# Patient Record
Sex: Male | Born: 1970 | Race: Black or African American | Hispanic: No | Marital: Single | Smoking: Never smoker
Health system: Southern US, Community
[De-identification: ages and names within clinical notes are randomized; demographics above are authoritative.]

## PROBLEM LIST (undated history)

## (undated) DIAGNOSIS — E876 Hypokalemia: Secondary | ICD-10-CM

## (undated) DIAGNOSIS — I10 Essential (primary) hypertension: Secondary | ICD-10-CM

## (undated) HISTORY — PX: ROTATOR CUFF REPAIR: SHX139

---

## 2002-03-07 ENCOUNTER — Emergency Department (HOSPITAL_COMMUNITY): Admission: EM | Admit: 2002-03-07 | Discharge: 2002-03-07 | Payer: Self-pay | Admitting: Emergency Medicine

## 2011-08-06 ENCOUNTER — Emergency Department (HOSPITAL_COMMUNITY)
Admission: EM | Admit: 2011-08-06 | Discharge: 2011-08-06 | Disposition: A | Discharge status: Home / Self Care | Payer: Self-pay | Attending: Emergency Medicine | Admitting: Emergency Medicine

## 2011-08-06 ENCOUNTER — Encounter (HOSPITAL_COMMUNITY): Payer: Self-pay

## 2011-08-06 DIAGNOSIS — S76219A Strain of adductor muscle, fascia and tendon of unspecified thigh, initial encounter: Secondary | ICD-10-CM

## 2011-08-06 DIAGNOSIS — IMO0002 Reserved for concepts with insufficient information to code with codable children: Secondary | ICD-10-CM | POA: Insufficient documentation

## 2011-08-06 DIAGNOSIS — Y998 Other external cause status: Secondary | ICD-10-CM | POA: Insufficient documentation

## 2011-08-06 DIAGNOSIS — Y9389 Activity, other specified: Secondary | ICD-10-CM | POA: Insufficient documentation

## 2011-08-06 DIAGNOSIS — X58XXXA Exposure to other specified factors, initial encounter: Secondary | ICD-10-CM | POA: Insufficient documentation

## 2011-08-06 MED ORDER — HYDROCODONE-ACETAMINOPHEN 5-325 MG PO TABS
1.0000 | ORAL_TABLET | Freq: Once | ORAL | Status: AC
  Administered 2011-08-06: 1 via ORAL
  Filled 2011-08-06: qty 1

## 2011-08-06 MED ORDER — IBUPROFEN 800 MG PO TABS
800.0000 mg | ORAL_TABLET | Freq: Once | ORAL | Status: AC
  Administered 2011-08-06: 800 mg via ORAL
  Filled 2011-08-06: qty 1

## 2011-08-06 MED ORDER — HYDROCODONE-ACETAMINOPHEN 5-325 MG PO TABS: 1.0000 | ORAL_TABLET | ORAL | Status: AC | PRN

## 2011-08-06 NOTE — ED Notes (Signed)
Started hurting when I got out of my truck on Thursday evening. Hurts in the crease between the groin and my left leg. Pain shoots down left leg per pt. Denies testicular pain. Hurts when moving a certain way.

## 2011-08-06 NOTE — Discharge Instructions (Signed)
Apply heat to the site for comfort. Use ibuprofen 800 mg three times a day. Use the stronger pain medicine in addition to the ibuprofen.

## 2011-08-06 NOTE — ED Provider Notes (Signed)
History     CSN: [id number]  Arrival date & time 08/06/11  0101   First MD Initiated Contact with Patient 08/06/11 [phone]      Chief Complaint  Patient presents with  . Groin Pain    (Consider location/radiation/quality/duration/timing/severity/associated sxs/prior treatment) HPI Mario Alvarez is a 41 y.o. male who presents to the Emergency Department complaining of left groin pain that began when he climbed out of his truck on Thursday. Pain is sharp and located in the crease of his left leg. It does not extend to the scrotum however it can with certain movements cause pain in the upper thigh. Pain limits ambulation at times due to severity. He has taken low dose ibuprofen with no relief.He denies weakness, numbness, tingling.  History reviewed. No pertinent past medical history.  History reviewed. No pertinent past surgical history.  History reviewed. No pertinent family history.  History  Substance Use Topics  . Smoking status: Never Smoker   . Smokeless tobacco: Not on file  . Alcohol Use: No      Review of Systems  Constitutional: Negative for fever.       10 Systems reviewed and are negative for acute change except as noted in the HPI.  HENT: Negative for congestion.   Eyes: Negative for discharge and redness.  Respiratory: Negative for cough and shortness of breath.   Cardiovascular: Negative for chest pain.  Gastrointestinal: Negative for vomiting and abdominal pain.  Musculoskeletal: Negative for back pain.       Groin pain  Skin: Negative for rash.  Neurological: Negative for syncope, numbness and headaches.  Psychiatric/Behavioral:       No behavior change.    Allergies  Review of patient's allergies indicates no known allergies.  Home Medications   Current Outpatient Rx  Name Route Sig Dispense Refill  . HYDROCODONE-ACETAMINOPHEN 5-325 MG PO TABS Oral Take 1 tablet by mouth every 4 (four) hours as needed for pain. 10 tablet 0    BP 171/86  Pulse  81  Temp 99.3 F (37.4 C) (Oral)  Resp 16  Ht 6' (1.829 m)  Wt 264 lb (119.75 kg)  BMI 35.80 kg/m2  SpO2 97%  Physical Exam  Nursing note and vitals reviewed. Constitutional: He appears well-developed and well-nourished.       Awake, alert, nontoxic appearance.  HENT:  Head: Atraumatic.  Eyes: Right eye exhibits no discharge. Left eye exhibits no discharge.  Neck: Neck supple.  Pulmonary/Chest: Effort normal. He exhibits no tenderness.  Abdominal: Soft. There is no tenderness. There is no rebound.  Musculoskeletal: He exhibits no tenderness.       Baseline ROM, no obvious new focal weakness.Focal tenderness to the adductor/symphysis region on the left with palpation. FROM to left hip. Leg strength 5/5. No swelling noted  Neurological:       Mental status and motor strength appears baseline for patient and situation.  Skin: No rash noted.  Psychiatric: He has a normal mood and affect.    ED Course  Procedures (including critical care time)  Labs Reviewed - No data to display No results found.   1. Groin strain       MDM  Patient with groin strain x 2 days precipitated by twisting as he got out of his truck. Given antiinflammatory and analgesic. Pt stable in ED with no significant deterioration in condition.The patient appears reasonably screened and/or stabilized for discharge and I doubt any other medical condition or other [facility] requiring further screening, evaluation,  or treatment in the ED at this time prior to discharge.  MDM Reviewed: nursing note and vitals           Nicoletta Dress. Colon Branch, MD 08/06/11 2024

## 2013-09-12 ENCOUNTER — Encounter (HOSPITAL_COMMUNITY): Payer: Self-pay | Admitting: Emergency Medicine

## 2013-09-12 ENCOUNTER — Emergency Department (HOSPITAL_COMMUNITY): Payer: PRIVATE HEALTH INSURANCE

## 2013-09-12 ENCOUNTER — Emergency Department (HOSPITAL_COMMUNITY)
Admission: EM | Admit: 2013-09-12 | Discharge: 2013-09-12 | Disposition: A | Discharge status: Home / Self Care | Payer: PRIVATE HEALTH INSURANCE | Attending: Emergency Medicine | Admitting: Emergency Medicine

## 2013-09-12 DIAGNOSIS — I10 Essential (primary) hypertension: Secondary | ICD-10-CM | POA: Insufficient documentation

## 2013-09-12 DIAGNOSIS — R42 Dizziness and giddiness: Secondary | ICD-10-CM | POA: Insufficient documentation

## 2013-09-12 DIAGNOSIS — R110 Nausea: Secondary | ICD-10-CM | POA: Diagnosis not present

## 2013-09-12 DIAGNOSIS — R51 Headache: Secondary | ICD-10-CM | POA: Diagnosis not present

## 2013-09-12 DIAGNOSIS — R519 Headache, unspecified: Secondary | ICD-10-CM

## 2013-09-12 DIAGNOSIS — R5381 Other malaise: Secondary | ICD-10-CM | POA: Diagnosis not present

## 2013-09-12 DIAGNOSIS — R6883 Chills (without fever): Secondary | ICD-10-CM | POA: Diagnosis not present

## 2013-09-12 DIAGNOSIS — R1084 Generalized abdominal pain: Secondary | ICD-10-CM | POA: Diagnosis not present

## 2013-09-12 DIAGNOSIS — R5383 Other fatigue: Secondary | ICD-10-CM

## 2013-09-12 DIAGNOSIS — R109 Unspecified abdominal pain: Secondary | ICD-10-CM

## 2013-09-12 LAB — CBC WITH DIFFERENTIAL/PLATELET
BASOS ABS: 0 10*3/uL (ref 0.0–0.1)
BASOS PCT: 0 % (ref 0–1)
Eosinophils Absolute: 0 10*3/uL (ref 0.0–0.7)
Eosinophils Relative: 0 % (ref 0–5)
HCT: 39.3 % (ref 39.0–52.0)
HEMOGLOBIN: 14.5 g/dL (ref 13.0–17.0)
Lymphocytes Relative: 4 % — ABNORMAL LOW (ref 12–46)
Lymphs Abs: 0.9 10*3/uL (ref 0.7–4.0)
MCH: 31.2 pg (ref 26.0–34.0)
MCHC: 36.9 g/dL — AB (ref 30.0–36.0)
MCV: 84.5 fL (ref 78.0–100.0)
MONOS PCT: 6 % (ref 3–12)
Monocytes Absolute: 1.3 10*3/uL — ABNORMAL HIGH (ref 0.1–1.0)
NEUTROS ABS: 18.9 10*3/uL — AB (ref 1.7–7.7)
NEUTROS PCT: 90 % — AB (ref 43–77)
Platelets: 190 10*3/uL (ref 150–400)
RBC: 4.65 MIL/uL (ref 4.22–5.81)
RDW: 13.5 % (ref 11.5–15.5)
WBC: 21.1 10*3/uL — ABNORMAL HIGH (ref 4.0–10.5)

## 2013-09-12 LAB — URINALYSIS, ROUTINE W REFLEX MICROSCOPIC
BILIRUBIN URINE: NEGATIVE
Glucose, UA: NEGATIVE mg/dL
HGB URINE DIPSTICK: NEGATIVE
Ketones, ur: NEGATIVE mg/dL
Leukocytes, UA: NEGATIVE
Nitrite: NEGATIVE
PH: 6 (ref 5.0–8.0)
Protein, ur: NEGATIVE mg/dL
Urobilinogen, UA: 0.2 mg/dL (ref 0.0–1.0)

## 2013-09-12 LAB — COMPREHENSIVE METABOLIC PANEL
ALBUMIN: 4.2 g/dL (ref 3.5–5.2)
ALK PHOS: 43 U/L (ref 39–117)
ALT: 18 U/L (ref 0–53)
AST: 20 U/L (ref 0–37)
Anion gap: 14 (ref 5–15)
BILIRUBIN TOTAL: 0.8 mg/dL (ref 0.3–1.2)
BUN: 13 mg/dL (ref 6–23)
CHLORIDE: 95 meq/L — AB (ref 96–112)
CO2: 28 mEq/L (ref 19–32)
Calcium: 9.5 mg/dL (ref 8.4–10.5)
Creatinine, Ser: 1.06 mg/dL (ref 0.50–1.35)
GFR calc Af Amer: >90 mL/min (ref >90)
GFR calc non Af Amer: 86 mL/min — ABNORMAL LOW (ref >90)
Glucose, Bld: 137 mg/dL — ABNORMAL HIGH (ref 70–99)
POTASSIUM: 3.7 meq/L (ref 3.7–5.3)
Sodium: 137 mEq/L (ref 137–147)
Total Protein: 7.9 g/dL (ref 6.0–8.3)

## 2013-09-12 LAB — LIPASE, BLOOD: Lipase: 21 U/L (ref 11–59)

## 2013-09-12 MED ORDER — IOHEXOL 300 MG/ML  SOLN
100.0000 mL | Freq: Once | INTRAMUSCULAR | Status: AC | PRN
  Administered 2013-09-12: 100 mL via INTRAVENOUS

## 2013-09-12 MED ORDER — SODIUM CHLORIDE 0.9 % IV BOLUS (SEPSIS): 1000.0000 mL | Freq: Once | INTRAVENOUS | Status: DC

## 2013-09-12 MED ORDER — MORPHINE SULFATE 4 MG/ML IJ SOLN
4.0000 mg | Freq: Once | INTRAMUSCULAR | Status: AC
  Administered 2013-09-12: 4 mg via INTRAVENOUS
  Filled 2013-09-12: qty 1

## 2013-09-12 MED ORDER — IOHEXOL 300 MG/ML  SOLN
50.0000 mL | Freq: Once | INTRAMUSCULAR | Status: AC | PRN
  Administered 2013-09-12: 50 mL via ORAL

## 2013-09-12 MED ORDER — SODIUM CHLORIDE 0.9 % IV BOLUS (SEPSIS)
1000.0000 mL | Freq: Once | INTRAVENOUS | Status: AC
  Administered 2013-09-12: 1000 mL via INTRAVENOUS

## 2013-09-12 MED ORDER — ONDANSETRON HCL 4 MG/2ML IJ SOLN
4.0000 mg | Freq: Once | INTRAMUSCULAR | Status: AC
  Administered 2013-09-12: 4 mg via INTRAVENOUS
  Filled 2013-09-12: qty 2

## 2013-09-12 NOTE — ED Notes (Signed)
MD at bedside. 

## 2013-09-12 NOTE — ED Notes (Signed)
C/o headache and SOB,  Had dizziness this am at work.  C/o abdomen mid to upper pain, rating 7.

## 2013-09-12 NOTE — ED Provider Notes (Signed)
CSN: [id number]     Arrival date & time 09/12/13  1238 History   First MD Initiated Contact with Patient 09/12/13 1251     Chief Complaint  Patient presents with  . Dizziness      Patient is a 43 y.o. male presenting with dizziness. The history is provided by the patient and a relative.  Dizziness Quality:  Lightheadedness Severity:  Moderate Onset quality:  Gradual Duration: several hours. Timing:  Constant Progression:  Worsening Chronicity:  New Relieved by:  Being still Worsened by:  Movement Associated symptoms: headaches, nausea and weakness   Associated symptoms: no blood in stool, no chest pain, no syncope and no vomiting   Associated symptoms comment:  "loosening stool"  pt reports he ate crab legs at [employer] two nights ago Ever since, he has had nausea, some dizziness, headache and abdominal pain No cough/sore throat No rash/tick bites He reports his stool is "loosening"   PMH -HTN   History  Substance Use Topics  . Smoking status: Never Smoker   . Smokeless tobacco: Not on file  . Alcohol Use: No    Review of Systems  Constitutional: Positive for chills and fatigue.  Respiratory: Negative for cough.   Cardiovascular: Negative for chest pain and syncope.  Gastrointestinal: Positive for nausea. Negative for vomiting and blood in stool.  Genitourinary: Negative for dysuria.  Neurological: Positive for dizziness and headaches.  All other systems reviewed and are negative.     Allergies  Review of patient's allergies indicates no known allergies.  Home Medications   Prior to Admission medications   Not on File   BP 124/60  Pulse 103  Temp(Src) 99.5 F (37.5 C) (Oral)  Resp 20  Ht 6' (1.829 m)  Wt 260 lb (117.935 kg)  BMI 35.25 kg/m2  SpO2 99% Physical Exam CONSTITUTIONAL: Well developed/well nourished HEAD: Normocephalic/atraumatic EYES: EOMI/PERRL ENMT: Mucous membranes moist NECK: supple no meningeal signs SPINE:entire spine  nontender CV: S1/S2 noted, no murmurs/rubs/gallops noted LUNGS: Lungs are clear to auscultation bilaterally, no apparent distress ABDOMEN: soft, diffuse mild tenderness noted, no rebound or guarding GU:no cva tenderness NEURO: Pt is awake/alert, moves all extremitiesx4. No arm/leg drift.  He can ambulate EXTREMITIES: pulses normal, full ROM SKIN: warm, color normal PSYCH: no abnormalities of mood noted  ED Course  Procedures   3:42 PM Pt observed for several hours I elected to perform CT abd/pelvis as pt had increasing abd pain No signs of acute appendicitis or other acute abd process (likely small liver hemangioma) He is ambulatory He is taking PO Aside from elevated WBC, Other labs reassuring Given symptoms of dizziness/abd pain/HA and also suspect pt has had fever.  Suspect viral syndrome Pt began to have mild cough during discharge - lungs clear, no hypoxia We discussed strict return precautions BP 111/54  Pulse 102  Temp(Src) 99.6 F (37.6 C) (Oral)  Resp 18  Ht 6' (1.829 m)  Wt 260 lb (117.935 kg)  BMI 35.25 kg/m2  SpO2 97%  Labs Review Labs Reviewed  CBC WITH DIFFERENTIAL - Abnormal; Notable for the following:    WBC 21.1 (*)    MCHC 36.9 (*)    Neutrophils Relative % 90 (*)    Neutro Abs 18.9 (*)    Lymphocytes Relative 4 (*)    Monocytes Absolute 1.3 (*)    All other components within normal limits  COMPREHENSIVE METABOLIC PANEL  LIPASE, BLOOD  URINALYSIS, ROUTINE W REFLEX MICROSCOPIC    Imaging Review Ct Abdomen  Pelvis W Contrast  09/12/2013   CLINICAL DATA:  Abdominal pain  EXAM: CT ABDOMEN AND PELVIS WITH CONTRAST  TECHNIQUE: Multidetector CT imaging of the abdomen and pelvis was performed using the standard protocol following bolus administration of intravenous contrast.  CONTRAST:  50mL OMNIPAQUE IOHEXOL 300 MG/ML SOLN, OMNIPAQUE IOHEXOL 300 MG/ML SOLN  COMPARISON:  None.  FINDINGS: Lung bases are clear.  No effusions.  Heart is normal size.   Low-density area within the right hepatic lobe measuring up to 2.9 cm, nonspecific. This may reflect a hemangioma. This does not appear to represent a cyst.  Gallbladder, spleen, pancreas, adrenals and kidneys are normal.  Normal appendix. Stomach, large and small bowel are unremarkable. No free fluid, free air or adenopathy. Aorta is normal caliber.  No acute bony abnormality.  IMPRESSION: Normal appendix.  Nonspecific 2.8 cm low-density lesion with in the liver. This could reflect a hemangioma. This could be further characterized with MRI.   Electronically Signed   By: Charlett Nose M.D.   On: 09/12/2013 15:03     Date: 09/12/2013 1337  Rate: 105  Rhythm: sinus tachycardia  QRS Axis: normal  Intervals: normal  ST/T Wave abnormalities: t wave flattening  Conduction Disutrbances:none    MDM   Final diagnoses:  Nonintractable headache, unspecified chronicity pattern, unspecified headache type  Abdominal pain, unspecified abdominal location  Dizziness    Nursing notes including past medical history and social history reviewed and considered in documentation Labs/vital reviewed and considered     Joya Gaskins, MD 09/12/13 1544

## 2013-09-12 NOTE — Discharge Instructions (Signed)
Abdominal (belly) pain can be caused by many things. any cases can be observed and treated at home after initial evaluation in the emergency department. Even though you are being discharged home, abdominal pain can be unpredictable. Therefore, you need a repeated exam if your pain does not resolve, returns, or worsens. Most patients with abdominal pain don't have to be admitted to the hospital or have surgery, but serious problems like appendicitis and gallbladder attacks can start out as nonspecific pain. Many abdominal conditions cannot be diagnosed in one visit, so follow-up evaluations are very important. °SEEK IMMEDIATE MEDICAL ATTENTION IF: °The pain does not go away or becomes severe, particularly over the next 8-12 hours.  °A temperature above 100.4F develops.  °Repeated vomiting occurs (multiple episodes).  °The pain becomes localized to portions of the abdomen. The right side could possibly be appendicitis. In an adult, the left lower portion of the abdomen could be colitis or diverticulitis.  °Blood is being passed in stools or vomit (bright red or black tarry stools).  °Return also if you develop chest pain, difficulty breathing, dizziness or fainting, or become confused, poorly responsive, or inconsolable. ° ° °You are having a headache. No specific cause was found today for your headache. It may have been a migraine or other cause of headache. Stress, anxiety, fatigue, and depression are common triggers for headaches. Your headache today does not appear to be life-threatening or require hospitalization, but often the exact cause of headaches is not determined in the emergency department. Therefore, follow-up with your doctor is very important to find out what may have caused your headache, and whether or not you need any further diagnostic testing or treatment. Sometimes headaches can appear benign (not harmful), but then more serious symptoms can develop which should prompt an immediate re-evaluation  by your doctor or the emergency department. ° °SEEK MEDICAL ATTENTION IF: ° °You develop possible problems with medications prescribed.  °The medications don't resolve your headache, if it recurs , or if you have multiple episodes of vomiting or can't take fluids. °You have a change from the usual headache. ° °RETURN IMMEDIATELY IF you develop a sudden, severe headache or confusion, become poorly responsive or faint, develop a fever above 100.4F or problem breathing, have a change in speech, vision, swallowing, or understanding, or develop new weakness, numbness, tingling, incoordination, or have a seizure. ° °

## 2016-07-08 ENCOUNTER — Emergency Department (HOSPITAL_COMMUNITY)
Admission: EM | Admit: 2016-07-08 | Discharge: 2016-07-08 | Disposition: A | Discharge status: Home / Self Care | Payer: BLUE CROSS/BLUE SHIELD | Attending: Emergency Medicine | Admitting: Emergency Medicine

## 2016-07-08 ENCOUNTER — Encounter (HOSPITAL_COMMUNITY): Payer: Self-pay

## 2016-07-08 DIAGNOSIS — Y998 Other external cause status: Secondary | ICD-10-CM | POA: Insufficient documentation

## 2016-07-08 DIAGNOSIS — Z79899 Other long term (current) drug therapy: Secondary | ICD-10-CM | POA: Insufficient documentation

## 2016-07-08 DIAGNOSIS — M5416 Radiculopathy, lumbar region: Secondary | ICD-10-CM | POA: Insufficient documentation

## 2016-07-08 DIAGNOSIS — I10 Essential (primary) hypertension: Secondary | ICD-10-CM | POA: Insufficient documentation

## 2016-07-08 DIAGNOSIS — Y92410 Unspecified street and highway as the place of occurrence of the external cause: Secondary | ICD-10-CM | POA: Diagnosis not present

## 2016-07-08 DIAGNOSIS — S161XXA Strain of muscle, fascia and tendon at neck level, initial encounter: Secondary | ICD-10-CM | POA: Diagnosis not present

## 2016-07-08 DIAGNOSIS — R51 Headache: Secondary | ICD-10-CM | POA: Insufficient documentation

## 2016-07-08 DIAGNOSIS — Y9389 Activity, other specified: Secondary | ICD-10-CM | POA: Diagnosis not present

## 2016-07-08 DIAGNOSIS — R519 Headache, unspecified: Secondary | ICD-10-CM

## 2016-07-08 DIAGNOSIS — M545 Low back pain: Secondary | ICD-10-CM | POA: Diagnosis present

## 2016-07-08 HISTORY — DX: Essential (primary) hypertension: I10

## 2016-07-08 HISTORY — DX: Hypokalemia: E87.6

## 2016-07-08 MED ORDER — LORAZEPAM 2 MG/ML IJ SOLN
1.0000 mg | Freq: Once | INTRAMUSCULAR | Status: AC
  Administered 2016-07-08: 1 mg via INTRAVENOUS
  Filled 2016-07-08: qty 1

## 2016-07-08 MED ORDER — ONDANSETRON HCL 4 MG/2ML IJ SOLN
4.0000 mg | Freq: Once | INTRAMUSCULAR | Status: AC
  Administered 2016-07-08: 4 mg via INTRAVENOUS
  Filled 2016-07-08: qty 2

## 2016-07-08 MED ORDER — HYDROCODONE-ACETAMINOPHEN 5-325 MG PO TABS: 2.0000 | ORAL_TABLET | ORAL | 0 refills | Status: DC | PRN

## 2016-07-08 MED ORDER — DIAZEPAM 5 MG PO TABS: 5.0000 mg | ORAL_TABLET | Freq: Two times a day (BID) | ORAL | 0 refills | Status: DC

## 2016-07-08 MED ORDER — DEXAMETHASONE SODIUM PHOSPHATE 10 MG/ML IJ SOLN
10.0000 mg | Freq: Once | INTRAMUSCULAR | Status: AC
  Administered 2016-07-08: 10 mg via INTRAVENOUS
  Filled 2016-07-08: qty 1

## 2016-07-08 MED ORDER — PREDNISONE 10 MG (21) PO TBPK: ORAL_TABLET | ORAL | 0 refills | Status: DC

## 2016-07-08 MED ORDER — MORPHINE SULFATE (PF) 4 MG/ML IV SOLN
4.0000 mg | Freq: Once | INTRAVENOUS | Status: AC
  Administered 2016-07-08: 4 mg via INTRAVENOUS
  Filled 2016-07-08: qty 1
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
## 2016-07-11 ENCOUNTER — Other Ambulatory Visit (HOSPITAL_COMMUNITY): Payer: Self-pay | Admitting: Internal Medicine

## 2016-07-11 ENCOUNTER — Ambulatory Visit (HOSPITAL_COMMUNITY)
Admission: RE | Admit: 2016-07-11 | Discharge: 2016-07-11 | Disposition: A | Discharge status: Home / Self Care | Payer: BLUE CROSS/BLUE SHIELD | Source: Ambulatory Visit | Attending: Emergency Medicine | Admitting: Emergency Medicine

## 2016-07-11 DIAGNOSIS — E119 Type 2 diabetes mellitus without complications: Secondary | ICD-10-CM | POA: Insufficient documentation

## 2016-07-11 DIAGNOSIS — R200 Anesthesia of skin: Secondary | ICD-10-CM | POA: Insufficient documentation

## 2016-07-11 DIAGNOSIS — R51 Headache: Secondary | ICD-10-CM | POA: Diagnosis not present

## 2016-07-11 DIAGNOSIS — R519 Headache, unspecified: Secondary | ICD-10-CM

## 2016-07-11 DIAGNOSIS — I10 Essential (primary) hypertension: Secondary | ICD-10-CM | POA: Diagnosis not present

## 2016-07-11 DIAGNOSIS — H538 Other visual disturbances: Secondary | ICD-10-CM | POA: Diagnosis not present

## 2016-07-11 DIAGNOSIS — M48061 Spinal stenosis, lumbar region without neurogenic claudication: Secondary | ICD-10-CM | POA: Insufficient documentation

## 2016-07-11 DIAGNOSIS — M5126 Other intervertebral disc displacement, lumbar region: Secondary | ICD-10-CM | POA: Insufficient documentation

## 2016-08-28 ENCOUNTER — Other Ambulatory Visit (HOSPITAL_COMMUNITY): Payer: Self-pay | Admitting: Emergency Medicine

## 2016-08-28 ENCOUNTER — Other Ambulatory Visit (HOSPITAL_COMMUNITY): Payer: Self-pay | Admitting: Internal Medicine

## 2016-08-28 DIAGNOSIS — R221 Localized swelling, mass and lump, neck: Secondary | ICD-10-CM

## 2016-08-28 DIAGNOSIS — G459 Transient cerebral ischemic attack, unspecified: Secondary | ICD-10-CM

## 2016-09-01 ENCOUNTER — Ambulatory Visit (HOSPITAL_COMMUNITY)
Admission: RE | Admit: 2016-09-01 | Discharge: 2016-09-01 | Disposition: A | Discharge status: Home / Self Care | Payer: BLUE CROSS/BLUE SHIELD | Source: Ambulatory Visit | Attending: Internal Medicine | Admitting: Internal Medicine

## 2016-09-01 DIAGNOSIS — G459 Transient cerebral ischemic attack, unspecified: Secondary | ICD-10-CM | POA: Insufficient documentation

## 2016-09-01 DIAGNOSIS — R221 Localized swelling, mass and lump, neck: Secondary | ICD-10-CM | POA: Insufficient documentation
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
## 2020-07-16 ENCOUNTER — Other Ambulatory Visit (HOSPITAL_COMMUNITY): Payer: Self-pay | Admitting: Neurosurgery

## 2020-07-16 ENCOUNTER — Other Ambulatory Visit: Payer: Self-pay | Admitting: Neurosurgery

## 2020-07-16 DIAGNOSIS — G959 Disease of spinal cord, unspecified: Secondary | ICD-10-CM

## 2020-07-17 ENCOUNTER — Ambulatory Visit (HOSPITAL_COMMUNITY)
Admission: RE | Admit: 2020-07-17 | Discharge: 2020-07-17 | Disposition: A | Discharge status: Home / Self Care | Payer: 59 | Source: Ambulatory Visit | Attending: Neurosurgery | Admitting: Neurosurgery

## 2020-07-17 ENCOUNTER — Other Ambulatory Visit: Payer: Self-pay

## 2020-07-17 ENCOUNTER — Inpatient Hospital Stay (HOSPITAL_COMMUNITY)
Admission: AD | Admit: 2020-07-17 | Discharge: 2020-07-20 | DRG: 473 | Disposition: A | Discharge status: Home Health | Payer: 59 | Source: Ambulatory Visit | Attending: Neurosurgery | Admitting: Neurosurgery

## 2020-07-17 DIAGNOSIS — Z888 Allergy status to other drugs, medicaments and biological substances status: Secondary | ICD-10-CM

## 2020-07-17 DIAGNOSIS — G959 Disease of spinal cord, unspecified: Secondary | ICD-10-CM

## 2020-07-17 DIAGNOSIS — Z20822 Contact with and (suspected) exposure to covid-19: Secondary | ICD-10-CM | POA: Diagnosis present

## 2020-07-17 DIAGNOSIS — Z79899 Other long term (current) drug therapy: Secondary | ICD-10-CM

## 2020-07-17 DIAGNOSIS — Z419 Encounter for procedure for purposes other than remedying health state, unspecified: Secondary | ICD-10-CM

## 2020-07-17 DIAGNOSIS — M4802 Spinal stenosis, cervical region: Secondary | ICD-10-CM | POA: Diagnosis present

## 2020-07-17 DIAGNOSIS — I10 Essential (primary) hypertension: Secondary | ICD-10-CM | POA: Diagnosis present

## 2020-07-17 DIAGNOSIS — M50022 Cervical disc disorder at C5-C6 level with myelopathy: Principal | ICD-10-CM | POA: Diagnosis present

## 2020-07-17 LAB — BASIC METABOLIC PANEL
Anion gap: 9 (ref 5–15)
BUN: 13 mg/dL (ref 6–20)
CO2: 27 mmol/L (ref 22–32)
Calcium: 9.2 mg/dL (ref 8.9–10.3)
Chloride: 101 mmol/L (ref 98–111)
Creatinine, Ser: 1.03 mg/dL (ref 0.61–1.24)
GFR, Estimated: >60 mL/min (ref >60)
Glucose, Bld: 117 mg/dL — ABNORMAL HIGH (ref 70–99)
Potassium: 3.6 mmol/L (ref 3.5–5.1)
Sodium: 137 mmol/L (ref 135–145)

## 2020-07-17 LAB — CBC
HCT: 38.5 % — ABNORMAL LOW (ref 39.0–52.0)
Hemoglobin: 13.9 g/dL (ref 13.0–17.0)
MCH: 30.5 pg (ref 26.0–34.0)
MCHC: 36.1 g/dL — ABNORMAL HIGH (ref 30.0–36.0)
MCV: 84.6 fL (ref 80.0–100.0)
Platelets: 208 10*3/uL (ref 150–400)
RBC: 4.55 MIL/uL (ref 4.22–5.81)
RDW: 12.6 % (ref 11.5–15.5)
WBC: 8.4 10*3/uL (ref 4.0–10.5)
nRBC: 0 % (ref 0.0–0.2)

## 2020-07-17 LAB — MRSA NEXT GEN BY PCR, NASAL: MRSA by PCR Next Gen: NOT DETECTED

## 2020-07-17 LAB — SARS CORONAVIRUS 2 (TAT 6-24 HRS): SARS Coronavirus 2: NEGATIVE

## 2020-07-17 LAB — HIV ANTIBODY (ROUTINE TESTING W REFLEX): HIV Screen 4th Generation wRfx: NONREACTIVE

## 2020-07-17 MED ORDER — BISACODYL 10 MG RE SUPP: 10.0000 mg | Freq: Every day | RECTAL | Status: DC | PRN

## 2020-07-17 MED ORDER — GABAPENTIN 600 MG PO TABS
600.0000 mg | ORAL_TABLET | Freq: Three times a day (TID) | ORAL | Status: DC
  Administered 2020-07-17 – 2020-07-20 (×8): 600 mg via ORAL
  Filled 2020-07-17 (×8): qty 1

## 2020-07-17 MED ORDER — DEXAMETHASONE SODIUM PHOSPHATE 4 MG/ML IJ SOLN
4.0000 mg | Freq: Four times a day (QID) | INTRAMUSCULAR | Status: DC
  Filled 2020-07-17 (×2): qty 1

## 2020-07-17 MED ORDER — METFORMIN HCL 500 MG PO TABS
500.0000 mg | ORAL_TABLET | Freq: Two times a day (BID) | ORAL | Status: DC
  Administered 2020-07-17 – 2020-07-20 (×4): 500 mg via ORAL
  Filled 2020-07-17 (×4): qty 1

## 2020-07-17 MED ORDER — SODIUM CHLORIDE 0.9% FLUSH: 3.0000 mL | INTRAVENOUS | Status: DC | PRN

## 2020-07-17 MED ORDER — SODIUM CHLORIDE 0.9 % IV SOLN: 250.0000 mL | INTRAVENOUS | Status: DC | PRN

## 2020-07-17 MED ORDER — HYDROCHLOROTHIAZIDE 12.5 MG PO CAPS
12.5000 mg | ORAL_CAPSULE | Freq: Every day | ORAL | Status: DC
  Administered 2020-07-17 – 2020-07-20 (×4): 12.5 mg via ORAL
  Filled 2020-07-17 (×4): qty 1

## 2020-07-17 MED ORDER — LOSARTAN POTASSIUM 50 MG PO TABS
50.0000 mg | ORAL_TABLET | Freq: Every day | ORAL | Status: DC
  Administered 2020-07-17 – 2020-07-20 (×4): 50 mg via ORAL
  Filled 2020-07-17 (×4): qty 1

## 2020-07-17 MED ORDER — DEXAMETHASONE SODIUM PHOSPHATE 4 MG/ML IJ SOLN
4.0000 mg | Freq: Four times a day (QID) | INTRAMUSCULAR | Status: AC
  Administered 2020-07-17 – 2020-07-20 (×11): 4 mg via INTRAVENOUS
  Filled 2020-07-17 (×12): qty 1

## 2020-07-17 MED ORDER — TIZANIDINE HCL 4 MG PO TABS
4.0000 mg | ORAL_TABLET | Freq: Three times a day (TID) | ORAL | Status: DC | PRN
  Administered 2020-07-17 – 2020-07-20 (×6): 4 mg via ORAL
  Filled 2020-07-17 (×7): qty 1

## 2020-07-17 MED ORDER — MAGNESIUM OXIDE -MG SUPPLEMENT 400 (240 MG) MG PO TABS
400.0000 mg | ORAL_TABLET | Freq: Every day | ORAL | Status: DC
  Administered 2020-07-17 – 2020-07-20 (×4): 400 mg via ORAL
  Filled 2020-07-17 (×4): qty 1

## 2020-07-17 MED ORDER — LOSARTAN POTASSIUM-HCTZ 50-12.5 MG PO TABS: 1.0000 | ORAL_TABLET | Freq: Every day | ORAL | Status: DC

## 2020-07-17 MED ORDER — POLYETHYLENE GLYCOL 3350 17 G PO PACK: 17.0000 g | PACK | Freq: Every day | ORAL | Status: DC | PRN

## 2020-07-17 MED ORDER — DEXAMETHASONE SODIUM PHOSPHATE 10 MG/ML IJ SOLN
10.0000 mg | Freq: Once | INTRAMUSCULAR | Status: AC
  Administered 2020-07-17: 10 mg via INTRAVENOUS
  Filled 2020-07-17: qty 1

## 2020-07-17 MED ORDER — FLEET ENEMA 7-19 GM/118ML RE ENEM: 1.0000 | ENEMA | Freq: Once | RECTAL | Status: DC | PRN

## 2020-07-17 MED ORDER — DIAZEPAM 5 MG PO TABS: 5.0000 mg | ORAL_TABLET | Freq: Two times a day (BID) | ORAL | Status: DC

## 2020-07-17 MED ORDER — HYDROCODONE-ACETAMINOPHEN 5-325 MG PO TABS
2.0000 | ORAL_TABLET | ORAL | Status: DC | PRN
  Administered 2020-07-17 – 2020-07-18 (×4): 2 via ORAL
  Filled 2020-07-17 (×4): qty 2

## 2020-07-17 MED ORDER — ACETAMINOPHEN 325 MG PO TABS: 650.0000 mg | ORAL_TABLET | Freq: Four times a day (QID) | ORAL | Status: DC | PRN

## 2020-07-17 MED ORDER — DOCUSATE SODIUM 100 MG PO CAPS
100.0000 mg | ORAL_CAPSULE | Freq: Two times a day (BID) | ORAL | Status: DC
  Administered 2020-07-17 – 2020-07-20 (×6): 100 mg via ORAL
  Filled 2020-07-17 (×6): qty 1

## 2020-07-17 MED ORDER — ACETAMINOPHEN 650 MG RE SUPP: 650.0000 mg | Freq: Four times a day (QID) | RECTAL | Status: DC | PRN

## 2020-07-17 MED ORDER — POTASSIUM CHLORIDE CRYS ER 10 MEQ PO TBCR
10.0000 meq | EXTENDED_RELEASE_TABLET | Freq: Every day | ORAL | Status: DC
  Administered 2020-07-17 – 2020-07-20 (×4): 10 meq via ORAL
  Filled 2020-07-17 (×6): qty 1

## 2020-07-17 MED ORDER — SODIUM CHLORIDE 0.9% FLUSH
3.0000 mL | Freq: Two times a day (BID) | INTRAVENOUS | Status: DC
  Administered 2020-07-17 – 2020-07-19 (×4): 3 mL via INTRAVENOUS

## 2020-07-17 NOTE — H&P (Signed)
Mario Alvarez is an 50 y.o. male.   Chief Complaint: Weakness HPI: 50 year old male with progressive bilateral upper extremity numbness weakness and some intermittent incontinence.  Patient recently seen in the office by Dr. Venetia Maxon who arranged an urgent MRI scan of the cervical spine.  This demonstrates evidence of an extremely large C5-6 disc herniation with critical spinal cord compression and marked signal abnormality.  He has some mild disc bulging on the left side at C4-5 with some early cord compression at this level.  He has a small central disc protrusion at C3-4 with some minimal spinal stenosis.  He has some mild degenerative disease at C6-7.  Patient is being admitted for treatment of his severe compressive myelopathy with plans for urgent surgery tomorrow per Dr. Venetia Maxon. Past Medical History:  Diagnosis Date   Hypertension    Low potassium syndrome     Past Surgical History:  Procedure Laterality Date   ROTATOR CUFF REPAIR      No family history on file. Social History:  reports that he has never smoked. He has never used smokeless tobacco. He reports that he does not drink alcohol and does not use drugs.  Allergies:  Allergies  Allergen Reactions   Lisinopril     Medications Prior to Admission  Medication Sig Dispense Refill   Cholecalciferol (VITAMIN D) 2000 UNITS CAPS Take 2 capsules by mouth daily.     diazepam (VALIUM) 5 MG tablet Take 1 tablet (5 mg total) by mouth 2 (two) times daily. 10 tablet 0   HYDROcodone-acetaminophen (NORCO/VICODIN) 5-325 MG tablet Take 2 tablets by mouth every 4 (four) hours as needed. 10 tablet 0   losartan-hydrochlorothiazide (HYZAAR) 50-12.5 MG per tablet Take 1 tablet by mouth daily.     magnesium oxide (MAG-OX) 400 MG tablet Take 400 mg by mouth daily.     potassium chloride (K-DUR) 10 MEQ tablet Take 10 mEq by mouth daily.     predniSONE (STERAPRED UNI-PAK 21 TAB) 10 MG (21) TBPK tablet Take 6 tabs by mouth daily  for 2 days, then 5  tabs for 2 days, then 4 tabs for 2 days, then 3 tabs for 2 days, 2 tabs for 2 days, then 1 tab by mouth daily for 2 days 42 tablet 0    No results found for this or any previous visit (from the past 48 hour(s)). MR CERVICAL SPINE WO CONTRAST  Result Date: 07/17/2020 CLINICAL DATA:  Neck pain. Bilateral upper and lower extremity paresthesias with difficulty walking. EXAM: MRI CERVICAL SPINE WITHOUT CONTRAST TECHNIQUE: Multiplanar, multisequence MR imaging of the cervical spine was performed. No intravenous contrast was administered. COMPARISON:  Cervical spine x-rays dated July 15, 2020. FINDINGS: Alignment: Straightening of the normal cervical lordosis. No listhesis. Vertebrae: No fracture, evidence of discitis, or bone lesion. Cord: Severe cord compression at C5-C6 with associated cord edema. Posterior Fossa, vertebral arteries, paraspinal tissues: Negative. Disc levels: C2-C3:  Small shallow central disc protrusion.  No stenosis. C3-C4: Small to moderate central disc extrusion migrating inferiorly, deforming and flattening the ventral cord. Mild spinal canal stenosis. No neuroforaminal stenosis. C4-C5: Small central disc protrusion contacting and slightly deforming the ventral cord. No stenosis. C5-C6: Mild disc bulging and bilateral uncovertebral hypertrophy. Severe large left paracentral disc herniation with severe spinal canal stenosis and cord compression. Mild bilateral neuroforaminal stenosis. C6-C7: Small central disc protrusion with annular fissure contacting and slightly deforming the ventral cord. Mild spinal canal stenosis. No neuroforaminal stenosis. C7-T1:  Negative. IMPRESSION: 1. Large left  paracentral disc herniation at C5-C6 resulting in severe spinal canal stenosis with cord compression and associated cord edema. 2. Smaller disc herniations at C3-C4, C5-C6, and C6-C7 with mild cord mass effect. These results will be called to the ordering clinician or representative by the Radiologist  Assistant, and communication documented in the PACS or [employer]. Electronically Signed   By: Obie Dredge M.D.   On: 07/17/2020 10:11    Pertinent items noted in HPI and remainder of comprehensive ROS otherwise negative.  Blood pressure (!) 167/96, pulse 84, temperature 98.7 F (37.1 C), temperature source Oral, resp. rate 20, height 6' (1.829 m), weight 121.2 kg, SpO2 98 %.  Patient is awake and alert.  He is oriented and appropriate.  Speech is fluent.  Judgment insight are intact.  Cranial nerve function normal bilateral.  Motor examination reveals 5/5 strength of both deltoid, biceps, brachioradialis, wrist extensor muscles and triceps muscles bilaterally.  He has significant grip strength weakness bilaterally grading at 4/5 and he has significant weakness in his intrinsics of his hands grading at 4 months or 5 left somewhat worse than right.  He has some mild spastic weakness in both lower extremities.  He has a relative sensory level in his mid to upper thoracic region.  He is markedly hyperreflexic in both lower extremities.  He has Hoffmann's responses in both hands.  Examination head ears eyes nose and throat is unremarked.  Chest and abdomen are benign.  Extremities are free from injury or deformity. 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  Patient also with some degree of stenosis C4-5.  Plan C4-5, C5-6 anterior cervical discectomy with interbody fusion utilizing interbody cages and allograft with anterior plate instrumentation.  I have discussed the risks and benefits with the patient.  Dr. Venetia Maxon will go over these risks again tomorrow.  Plan for surgery urgently tomorrow.  Mario Alvarez 07/17/2020, 12:32 PM

## 2020-07-17 NOTE — Progress Notes (Signed)
Patient arrived to [phone] as a direct admit. Vital signs stable. C/o neck pain and numbness in all four extremities. Patient placed on telemetry and CMMD notified. On-call provider notified and new orders received. Patient oriented to room and bed left low, locked with call bell left within reach.   Allegra Grana RN

## 2020-07-18 ENCOUNTER — Encounter (HOSPITAL_COMMUNITY)
Admission: AD | Disposition: A | Discharge status: Home Health | Payer: Self-pay | Source: Ambulatory Visit | Attending: Neurosurgery

## 2020-07-18 ENCOUNTER — Inpatient Hospital Stay (HOSPITAL_COMMUNITY): Payer: 59

## 2020-07-18 ENCOUNTER — Inpatient Hospital Stay (HOSPITAL_COMMUNITY): Payer: 59 | Admitting: Anesthesiology

## 2020-07-18 HISTORY — PX: ANTERIOR CERVICAL DECOMP/DISCECTOMY FUSION: SHX1161

## 2020-07-18 LAB — GLUCOSE, CAPILLARY
Glucose-Capillary: 163 mg/dL — ABNORMAL HIGH (ref 70–99)
Glucose-Capillary: 313 mg/dL — ABNORMAL HIGH (ref 70–99)

## 2020-07-18 SURGERY — ANTERIOR CERVICAL DECOMPRESSION/DISCECTOMY FUSION 2 LEVEL/HARDWARE REMOVAL
Anesthesia: General | Site: Neck

## 2020-07-18 MED ORDER — ACETAMINOPHEN 325 MG PO TABS: 650.0000 mg | ORAL_TABLET | ORAL | Status: DC | PRN

## 2020-07-18 MED ORDER — CHLORHEXIDINE GLUCONATE 0.12 % MT SOLN: 15.0000 mL | Freq: Once | OROMUCOSAL | Status: AC

## 2020-07-18 MED ORDER — FENTANYL CITRATE (PF) 100 MCG/2ML IJ SOLN
25.0000 ug | INTRAMUSCULAR | Status: DC | PRN
  Administered 2020-07-18 (×2): 50 ug via INTRAVENOUS

## 2020-07-18 MED ORDER — ACETAMINOPHEN 10 MG/ML IV SOLN
INTRAVENOUS | Status: DC | PRN
  Administered 2020-07-18: 1000 mg via INTRAVENOUS

## 2020-07-18 MED ORDER — SUGAMMADEX SODIUM 200 MG/2ML IV SOLN
INTRAVENOUS | Status: DC | PRN
  Administered 2020-07-18: 300 mg via INTRAVENOUS

## 2020-07-18 MED ORDER — DOCUSATE SODIUM 100 MG PO CAPS: 100.0000 mg | ORAL_CAPSULE | Freq: Two times a day (BID) | ORAL | Status: DC

## 2020-07-18 MED ORDER — PANTOPRAZOLE SODIUM 40 MG IV SOLR
40.0000 mg | Freq: Every day | INTRAVENOUS | Status: DC
  Administered 2020-07-18 – 2020-07-19 (×2): 40 mg via INTRAVENOUS
  Filled 2020-07-18 (×2): qty 40

## 2020-07-18 MED ORDER — METHOCARBAMOL 500 MG PO TABS: 500.0000 mg | ORAL_TABLET | Freq: Four times a day (QID) | ORAL | Status: DC | PRN

## 2020-07-18 MED ORDER — ROCURONIUM BROMIDE 10 MG/ML (PF) SYRINGE
PREFILLED_SYRINGE | INTRAVENOUS | Status: DC | PRN
  Administered 2020-07-18: 100 mg via INTRAVENOUS

## 2020-07-18 MED ORDER — ALBUMIN HUMAN 5 % IV SOLN: INTRAVENOUS | Status: DC | PRN

## 2020-07-18 MED ORDER — ORAL CARE MOUTH RINSE: 15.0000 mL | Freq: Once | OROMUCOSAL | Status: AC

## 2020-07-18 MED ORDER — INSULIN ASPART 100 UNIT/ML IJ SOLN
4.0000 [IU] | Freq: Three times a day (TID) | INTRAMUSCULAR | Status: DC
  Administered 2020-07-19 – 2020-07-20 (×4): 4 [IU] via SUBCUTANEOUS

## 2020-07-18 MED ORDER — SODIUM CHLORIDE 0.9 % IV SOLN: 250.0000 mL | INTRAVENOUS | Status: DC

## 2020-07-18 MED ORDER — OXYCODONE HCL 5 MG PO TABS: 5.0000 mg | ORAL_TABLET | ORAL | Status: DC | PRN

## 2020-07-18 MED ORDER — KETOROLAC TROMETHAMINE 15 MG/ML IJ SOLN: 7.5000 mg | Freq: Four times a day (QID) | INTRAMUSCULAR | Status: DC

## 2020-07-18 MED ORDER — ZOLPIDEM TARTRATE 5 MG PO TABS
5.0000 mg | ORAL_TABLET | Freq: Every evening | ORAL | Status: DC | PRN
Start: 2020-07-18 — End: 2020-07-20

## 2020-07-18 MED ORDER — ACETAMINOPHEN 10 MG/ML IV SOLN: 1000.0000 mg | Freq: Once | INTRAVENOUS | Status: DC | PRN

## 2020-07-18 MED ORDER — LIDOCAINE-EPINEPHRINE 1 %-1:100000 IJ SOLN
INTRAMUSCULAR | Status: AC
  Filled 2020-07-18: qty 1

## 2020-07-18 MED ORDER — ONDANSETRON HCL 4 MG/2ML IJ SOLN: 4.0000 mg | Freq: Four times a day (QID) | INTRAMUSCULAR | Status: DC | PRN

## 2020-07-18 MED ORDER — OXYCODONE HCL 5 MG/5ML PO SOLN: 5.0000 mg | Freq: Once | ORAL | Status: DC | PRN

## 2020-07-18 MED ORDER — PHENYLEPHRINE HCL-NACL 10-0.9 MG/250ML-% IV SOLN
INTRAVENOUS | Status: DC | PRN
  Administered 2020-07-18: 15 ug/min via INTRAVENOUS

## 2020-07-18 MED ORDER — MENTHOL 3 MG MT LOZG
1.0000 | LOZENGE | OROMUCOSAL | Status: DC | PRN
  Administered 2020-07-18 – 2020-07-19 (×2): 3 mg via ORAL
  Filled 2020-07-18: qty 9

## 2020-07-18 MED ORDER — HYDROCODONE-ACETAMINOPHEN 5-325 MG PO TABS
2.0000 | ORAL_TABLET | ORAL | Status: DC | PRN
  Administered 2020-07-18 – 2020-07-20 (×8): 2 via ORAL
  Filled 2020-07-18 (×9): qty 2

## 2020-07-18 MED ORDER — FLEET ENEMA 7-19 GM/118ML RE ENEM: 1.0000 | ENEMA | Freq: Once | RECTAL | Status: DC | PRN

## 2020-07-18 MED ORDER — KCL IN DEXTROSE-NACL 20-5-0.45 MEQ/L-%-% IV SOLN
INTRAVENOUS | Status: DC
  Filled 2020-07-18: qty 1000

## 2020-07-18 MED ORDER — FENTANYL CITRATE (PF) 250 MCG/5ML IJ SOLN
INTRAMUSCULAR | Status: DC | PRN
  Administered 2020-07-18: 25 ug via INTRAVENOUS
  Administered 2020-07-18 (×3): 50 ug via INTRAVENOUS
  Administered 2020-07-18: 100 ug via INTRAVENOUS
  Administered 2020-07-18: 25 ug via INTRAVENOUS
  Administered 2020-07-18: 50 ug via INTRAVENOUS

## 2020-07-18 MED ORDER — INSULIN ASPART 100 UNIT/ML IJ SOLN
0.0000 [IU] | Freq: Every day | INTRAMUSCULAR | Status: DC
  Administered 2020-07-18: 4 [IU] via SUBCUTANEOUS

## 2020-07-18 MED ORDER — BISACODYL 10 MG RE SUPP: 10.0000 mg | Freq: Every day | RECTAL | Status: DC | PRN

## 2020-07-18 MED ORDER — ACETAMINOPHEN 10 MG/ML IV SOLN
INTRAVENOUS | Status: AC
  Filled 2020-07-18: qty 100

## 2020-07-18 MED ORDER — INSULIN ASPART 100 UNIT/ML IJ SOLN
0.0000 [IU] | Freq: Three times a day (TID) | INTRAMUSCULAR | Status: DC
  Administered 2020-07-19: 5 [IU] via SUBCUTANEOUS
  Administered 2020-07-19: 3 [IU] via SUBCUTANEOUS
  Administered 2020-07-19: 5 [IU] via SUBCUTANEOUS
  Administered 2020-07-20 (×2): 3 [IU] via SUBCUTANEOUS

## 2020-07-18 MED ORDER — ONDANSETRON HCL 4 MG PO TABS: 4.0000 mg | ORAL_TABLET | Freq: Four times a day (QID) | ORAL | Status: DC | PRN

## 2020-07-18 MED ORDER — PROPOFOL 10 MG/ML IV BOLUS
INTRAVENOUS | Status: DC | PRN
  Administered 2020-07-18: 170 mg via INTRAVENOUS

## 2020-07-18 MED ORDER — ACETAMINOPHEN 500 MG PO TABS: 1000.0000 mg | ORAL_TABLET | Freq: Once | ORAL | Status: DC | PRN

## 2020-07-18 MED ORDER — HYDROMORPHONE HCL 1 MG/ML IJ SOLN: 0.5000 mg | INTRAMUSCULAR | Status: DC | PRN

## 2020-07-18 MED ORDER — CEFAZOLIN SODIUM-DEXTROSE 2-4 GM/100ML-% IV SOLN
2.0000 g | INTRAVENOUS | Status: DC
  Filled 2020-07-18: qty 100

## 2020-07-18 MED ORDER — FENTANYL CITRATE (PF) 250 MCG/5ML IJ SOLN
INTRAMUSCULAR | Status: AC
  Filled 2020-07-18: qty 5

## 2020-07-18 MED ORDER — LIDOCAINE 2% (20 MG/ML) 5 ML SYRINGE
INTRAMUSCULAR | Status: DC | PRN
  Administered 2020-07-18: 80 mg via INTRAVENOUS

## 2020-07-18 MED ORDER — SODIUM CHLORIDE 0.9% FLUSH: 3.0000 mL | INTRAVENOUS | Status: DC | PRN

## 2020-07-18 MED ORDER — CEFAZOLIN SODIUM-DEXTROSE 2-4 GM/100ML-% IV SOLN
2.0000 g | Freq: Three times a day (TID) | INTRAVENOUS | Status: AC
  Administered 2020-07-18 – 2020-07-19 (×2): 2 g via INTRAVENOUS
  Filled 2020-07-18 (×2): qty 100

## 2020-07-18 MED ORDER — 0.9 % SODIUM CHLORIDE (POUR BTL) OPTIME
TOPICAL | Status: DC | PRN
  Administered 2020-07-18: 1000 mL

## 2020-07-18 MED ORDER — FENTANYL CITRATE (PF) 100 MCG/2ML IJ SOLN
INTRAMUSCULAR | Status: AC
  Filled 2020-07-18: qty 2

## 2020-07-18 MED ORDER — DEXAMETHASONE SODIUM PHOSPHATE 10 MG/ML IJ SOLN
INTRAMUSCULAR | Status: DC | PRN
  Administered 2020-07-18: 10 mg via INTRAVENOUS

## 2020-07-18 MED ORDER — HYDROCODONE-ACETAMINOPHEN 5-325 MG PO TABS: 1.0000 | ORAL_TABLET | ORAL | Status: DC | PRN

## 2020-07-18 MED ORDER — LIDOCAINE-EPINEPHRINE 1 %-1:100000 IJ SOLN
INTRAMUSCULAR | Status: DC | PRN
  Administered 2020-07-18: 5 mL via INTRADERMAL

## 2020-07-18 MED ORDER — PHENYLEPHRINE HCL (PRESSORS) 10 MG/ML IV SOLN
INTRAVENOUS | Status: DC | PRN
  Administered 2020-07-18 (×2): 80 ug via INTRAVENOUS

## 2020-07-18 MED ORDER — KETOROLAC TROMETHAMINE 15 MG/ML IJ SOLN
15.0000 mg | Freq: Four times a day (QID) | INTRAMUSCULAR | Status: AC
  Administered 2020-07-18 – 2020-07-19 (×4): 15 mg via INTRAVENOUS
  Filled 2020-07-18 (×4): qty 1

## 2020-07-18 MED ORDER — LACTATED RINGERS IV SOLN: INTRAVENOUS | Status: DC

## 2020-07-18 MED ORDER — MIDAZOLAM HCL 2 MG/2ML IJ SOLN
INTRAMUSCULAR | Status: AC
  Filled 2020-07-18: qty 2

## 2020-07-18 MED ORDER — THROMBIN 5000 UNITS EX SOLR
OROMUCOSAL | Status: DC | PRN
  Administered 2020-07-18: 5 mL via TOPICAL

## 2020-07-18 MED ORDER — METHOCARBAMOL 1000 MG/10ML IJ SOLN
500.0000 mg | Freq: Four times a day (QID) | INTRAVENOUS | Status: DC | PRN
  Filled 2020-07-18: qty 5

## 2020-07-18 MED ORDER — ACETAMINOPHEN 650 MG RE SUPP: 650.0000 mg | RECTAL | Status: DC | PRN

## 2020-07-18 MED ORDER — ACETAMINOPHEN 160 MG/5ML PO SOLN: 1000.0000 mg | Freq: Once | ORAL | Status: DC | PRN

## 2020-07-18 MED ORDER — ONDANSETRON HCL 4 MG/2ML IJ SOLN
INTRAMUSCULAR | Status: DC | PRN
  Administered 2020-07-18: 4 mg via INTRAVENOUS

## 2020-07-18 MED ORDER — CHLORHEXIDINE GLUCONATE 0.12 % MT SOLN
OROMUCOSAL | Status: AC
  Administered 2020-07-18: 15 mL via OROMUCOSAL
  Filled 2020-07-18: qty 15

## 2020-07-18 MED ORDER — OXYCODONE HCL 5 MG PO TABS: 5.0000 mg | ORAL_TABLET | Freq: Once | ORAL | Status: DC | PRN

## 2020-07-18 MED ORDER — CEFAZOLIN SODIUM-DEXTROSE 2-3 GM-%(50ML) IV SOLR
INTRAVENOUS | Status: DC | PRN
  Administered 2020-07-18: 2 g via INTRAVENOUS

## 2020-07-18 MED ORDER — GABAPENTIN 300 MG PO CAPS: 300.0000 mg | ORAL_CAPSULE | Freq: Three times a day (TID) | ORAL | Status: DC

## 2020-07-18 MED ORDER — PROPOFOL 10 MG/ML IV BOLUS
INTRAVENOUS | Status: AC
  Filled 2020-07-18: qty 40

## 2020-07-18 MED ORDER — ALUM & MAG HYDROXIDE-SIMETH 200-200-20 MG/5ML PO SUSP: 30.0000 mL | Freq: Four times a day (QID) | ORAL | Status: DC | PRN

## 2020-07-18 MED ORDER — MIDAZOLAM HCL 2 MG/2ML IJ SOLN
INTRAMUSCULAR | Status: DC | PRN
  Administered 2020-07-18: 2 mg via INTRAVENOUS

## 2020-07-18 MED ORDER — SODIUM CHLORIDE 0.9% FLUSH
3.0000 mL | Freq: Two times a day (BID) | INTRAVENOUS | Status: DC
  Administered 2020-07-18 – 2020-07-19 (×3): 3 mL via INTRAVENOUS

## 2020-07-18 MED ORDER — BUPIVACAINE HCL (PF) 0.5 % IJ SOLN
INTRAMUSCULAR | Status: AC
  Filled 2020-07-18: qty 30

## 2020-07-18 MED ORDER — POLYETHYLENE GLYCOL 3350 17 G PO PACK: 17.0000 g | PACK | Freq: Every day | ORAL | Status: DC | PRN

## 2020-07-18 MED ORDER — THROMBIN 5000 UNITS EX SOLR
CUTANEOUS | Status: AC
  Filled 2020-07-18: qty 5000

## 2020-07-18 MED ORDER — PHENOL 1.4 % MT LIQD: 1.0000 | OROMUCOSAL | Status: DC | PRN

## 2020-07-18 MED ORDER — BUPIVACAINE HCL (PF) 0.5 % IJ SOLN
INTRAMUSCULAR | Status: DC | PRN
  Administered 2020-07-18: 5 mL

## 2020-07-18 SURGICAL SUPPLY — 61 items
BAND RUBBER #18 3X1/16 STRL (MISCELLANEOUS) ×6 IMPLANT
BASKET BONE COLLECTION (BASKET) IMPLANT
BIT DRILL NEURO 2X3.1 SFT TUCH (MISCELLANEOUS) ×1 IMPLANT
BIT DRILL OZARK SU 2.3X14 (DRILL) ×1 IMPLANT
BLADE ULTRA TIP 2M (BLADE) IMPLANT
BNDG GAUZE ELAST 4 BULKY (GAUZE/BANDAGES/DRESSINGS) IMPLANT
BUR BARREL STRAIGHT FLUTE 4.0 (BURR) ×3 IMPLANT
CANISTER SUCT 3000ML PPV (MISCELLANEOUS) ×3 IMPLANT
CARTRIDGE OIL MAESTRO DRILL (MISCELLANEOUS) ×1 IMPLANT
COVER MAYO STAND STRL (DRAPES) ×3 IMPLANT
COVER WAND RF STERILE (DRAPES) ×3 IMPLANT
DECANTER SPIKE VIAL GLASS SM (MISCELLANEOUS) ×3 IMPLANT
DERMABOND ADVANCED (GAUZE/BANDAGES/DRESSINGS) ×2
DERMABOND ADVANCED .7 DNX12 (GAUZE/BANDAGES/DRESSINGS) ×1 IMPLANT
DIFFUSER DRILL AIR PNEUMATIC (MISCELLANEOUS) ×3 IMPLANT
DRAPE HALF SHEET 40X57 (DRAPES) IMPLANT
DRAPE LAPAROTOMY 100X72 PEDS (DRAPES) ×3 IMPLANT
DRAPE MICROSCOPE LEICA (MISCELLANEOUS) ×3 IMPLANT
DRILL NEURO 2X3.1 SOFT TOUCH (MISCELLANEOUS) ×3
DRILL OZARK SU 2.3X14 (DRILL) ×3
DRSG OPSITE POSTOP 3X4 (GAUZE/BANDAGES/DRESSINGS) ×3 IMPLANT
DRSG OPSITE POSTOP 4X6 (GAUZE/BANDAGES/DRESSINGS) ×3 IMPLANT
DURAPREP 6ML APPLICATOR 50/CS (WOUND CARE) ×3 IMPLANT
ELECT COATED BLADE 2.86 ST (ELECTRODE) ×3 IMPLANT
ELECT REM PT RETURN 9FT ADLT (ELECTROSURGICAL) ×3
ELECTRODE REM PT RTRN 9FT ADLT (ELECTROSURGICAL) ×1 IMPLANT
GAUZE 4X4 16PLY RFD (DISPOSABLE) IMPLANT
GAUZE SPONGE 4X4 12PLY STRL (GAUZE/BANDAGES/DRESSINGS) IMPLANT
GLOVE BIO SURGEON STRL SZ8 (GLOVE) ×3 IMPLANT
GLOVE BIOGEL PI IND STRL 8.5 (GLOVE) ×1 IMPLANT
GLOVE BIOGEL PI INDICATOR 8.5 (GLOVE) ×2
GLOVE ECLIPSE 8.0 STRL XLNG CF (GLOVE) ×3 IMPLANT
GLOVE EXAM NITRILE XL STR (GLOVE) IMPLANT
GLOVE SRG 8 PF TXTR STRL LF DI (GLOVE) ×1 IMPLANT
GLOVE SURG UNDER POLY LF SZ8 (GLOVE) ×3
GOWN STRL REUS W/ TWL LRG LVL3 (GOWN DISPOSABLE) IMPLANT
GOWN STRL REUS W/ TWL XL LVL3 (GOWN DISPOSABLE) IMPLANT
GOWN STRL REUS W/TWL 2XL LVL3 (GOWN DISPOSABLE) IMPLANT
GOWN STRL REUS W/TWL LRG LVL3 (GOWN DISPOSABLE)
GOWN STRL REUS W/TWL XL LVL3 (GOWN DISPOSABLE)
HALTER HD/CHIN CERV TRACTION D (MISCELLANEOUS) ×3 IMPLANT
HEMOSTAT POWDER KIT SURGIFOAM (HEMOSTASIS) ×3 IMPLANT
KIT BASIN OR (CUSTOM PROCEDURE TRAY) ×3 IMPLANT
KIT TURNOVER KIT B (KITS) ×3 IMPLANT
NEEDLE HYPO 25X1 1.5 SAFETY (NEEDLE) ×3 IMPLANT
NEEDLE SPNL 22GX3.5 QUINCKE BK (NEEDLE) ×3 IMPLANT
NS IRRIG 1000ML POUR BTL (IV SOLUTION) ×3 IMPLANT
OIL CARTRIDGE MAESTRO DRILL (MISCELLANEOUS) ×3
PACK LAMINECTOMY NEURO (CUSTOM PROCEDURE TRAY) ×3 IMPLANT
PAD ARMBOARD 7.5X6 YLW CONV (MISCELLANEOUS) ×3 IMPLANT
PEEK SPACER AVS AS 7X14X16X4% (Peek) ×3 IMPLANT
PIN DISTRACTION 14MM (PIN) ×6 IMPLANT
PLATE ANT CERV OZARK 22 1LVL (Plate) ×3 IMPLANT
SCREW VA ST OZARK 4X14 (Screw) ×12 IMPLANT
SPONGE INTESTINAL PEANUT (DISPOSABLE) ×6 IMPLANT
SPONGE SURGIFOAM ABS GEL 100 (HEMOSTASIS) IMPLANT
STAPLER SKIN PROX WIDE 3.9 (STAPLE) IMPLANT
SUT VIC AB 3-0 SH 8-18 (SUTURE) ×3 IMPLANT
TOWEL GREEN STERILE (TOWEL DISPOSABLE) ×3 IMPLANT
TOWEL GREEN STERILE FF (TOWEL DISPOSABLE) ×3 IMPLANT
WATER STERILE IRR 1000ML POUR (IV SOLUTION) ×3 IMPLANT

## 2020-07-18 NOTE — Progress Notes (Signed)
Overall little better this morning.  Patient states that he has better strength and sensation in his hands.  He has improved strength in his left lower extremity.  Patient with critical cervical stenosis secondary to a large C5-6 disc herniation.  Plan for anterior cervical discectomy with Dr.Stern later today.  I discussed situation with Dr. Venetia Maxon.  We are in agreement that the C5-6 disc herniation is the primary problem at this point likely the only area of his neck that need to be treated.

## 2020-07-18 NOTE — Transfer of Care (Signed)
Immediate Anesthesia Transfer of Care Note  Patient: Mario Alvarez  Procedure(s) Performed: ANTERIOR CERVICAL DECOMPRESSION/DISCECTOMY FUSION Cervical five - six (Neck)  Patient Location: PACU  Anesthesia Type:General  Level of Consciousness: awake, alert  and oriented  Airway & Oxygen Therapy: Patient Spontanous Breathing  Post-op Assessment: Report given to RN and Post -op Vital signs reviewed and stable  Post vital signs: Reviewed and stable  Last Vitals:  Vitals Value Taken Time  BP    Temp    Pulse 94 07/18/20 1720  Resp 25 07/18/20 1720  SpO2 95 % 07/18/20 1720  Vitals shown include unvalidated device data.  Last Pain:  Vitals:   07/18/20 1425  TempSrc: Oral  PainSc:          Complications: No notable events documented.

## 2020-07-18 NOTE — Anesthesia Preprocedure Evaluation (Signed)
Anesthesia Evaluation  Patient identified by MRN, date of birth, ID band Patient awake    Reviewed: Allergy & Precautions, NPO status , Patient's Chart, lab work & pertinent test results  History of Anesthesia Complications Negative for: history of anesthetic complications  Airway Mallampati: III  TM Distance: >3 FB Neck ROM: Limited    Dental  (+) Teeth Intact, Dental Advisory Given   Pulmonary neg pulmonary ROS,  Covid-19 Nucleic Acid Test Results Lab Results      Component                Value               Date                      SARSCOV2NAA              NEGATIVE            07/17/2020              breath sounds clear to auscultation       Cardiovascular hypertension, Pt. on medications (-) angina(-) Past MI and (-) CHF  Rhythm:Regular     Neuro/Psych " He has significant grip strength weakness bilaterally grading at 4/5 and he has significant weakness in his intrinsics of his hands grading at 4 months or 5 left somewhat worse than right.  He has some mild spastic weakness in both lower extremities.  He has a relative sensory level in his mid to upper thoracic region.  He is markedly hyperreflexic in both lower extremities.  He has Hoffmann's responses in both hands." Dr Jordan Likes  Neuromuscular disease negative psych ROS   GI/Hepatic negative GI ROS, Neg liver ROS,   Endo/Other  diabetes  Renal/GU negative Renal ROS     Musculoskeletal negative musculoskeletal ROS (+)   Abdominal   Peds  Hematology negative hematology ROS (+)   Anesthesia Other Findings   Reproductive/Obstetrics                             Anesthesia Physical Anesthesia Plan  ASA: 2  Anesthesia Plan: General   Post-op Pain Management:    Induction: Intravenous  PONV Risk Score and Plan: 2 and Ondansetron and Dexamethasone  Airway Management Planned: Oral ETT and Video Laryngoscope Planned  Additional Equipment:  None  Intra-op Plan:   Post-operative Plan: Extubation in OR  Informed Consent: I have reviewed the patients History and Physical, chart, labs and discussed the procedure including the risks, benefits and alternatives for the proposed anesthesia with the patient or authorized representative who has indicated his/her understanding and acceptance.     Dental advisory given  Plan Discussed with: CRNA and Surgeon  Anesthesia Plan Comments:         Anesthesia Quick Evaluation

## 2020-07-18 NOTE — Anesthesia Procedure Notes (Signed)
Procedure Name: Intubation Date/Time: 07/18/2020 3:17 PM Performed by: Dairl Ponder, CRNA Pre-anesthesia Checklist: Patient identified, Emergency Drugs available, Suction available, Patient being monitored and Timeout performed Patient Re-evaluated:Patient Re-evaluated prior to induction Oxygen Delivery Method: Circle system utilized Preoxygenation: Pre-oxygenation with 100% oxygen Induction Type: IV induction Ventilation: Mask ventilation without difficulty Laryngoscope Size: Glidescope and 4 (Limited neck ROM) Grade View: Grade I Tube type: Oral Tube size: 7.5 mm Number of attempts: 1 Airway Equipment and Method: Stylet Placement Confirmation: ETT inserted through vocal cords under direct vision, positive ETCO2 and breath sounds checked- equal and bilateral Secured at: 24 cm Tube secured with: Tape Dental Injury: Teeth and Oropharynx as per pre-operative assessment

## 2020-07-18 NOTE — Anesthesia Postprocedure Evaluation (Signed)
Anesthesia Post Note  Patient: Mario Alvarez  Procedure(s) Performed: ANTERIOR CERVICAL DECOMPRESSION/DISCECTOMY FUSION Cervical five - six (Neck)     Patient location during evaluation: PACU Anesthesia Type: General Level of consciousness: awake and alert Pain management: pain level controlled Vital Signs Assessment: post-procedure vital signs reviewed and stable Respiratory status: spontaneous breathing, nonlabored ventilation, respiratory function stable and patient connected to nasal cannula oxygen Cardiovascular status: blood pressure returned to baseline and stable Postop Assessment: no apparent nausea or vomiting Anesthetic complications: no   No notable events documented.  Last Vitals:  Vitals:   07/18/20 1754 07/18/20 1808  BP:  (!) 157/82  Pulse: 87 91  Resp: 14 16  Temp: (!) 36.2 C 36.4 C  SpO2: 94% 97%    Last Pain:  Vitals:   07/18/20 1808  TempSrc: Oral  PainSc:                  [name]

## 2020-07-18 NOTE — Progress Notes (Signed)
Awake, alert, conversant.  Significant improvement in bilateral upper extremity strength including both hand intrinsics and grips.  Patient notes improvement in numbness, particularly in perineum.  He is doing well.

## 2020-07-18 NOTE — Progress Notes (Signed)
Orthopedic Tech Progress Note Patient Details:  Mario Alvarez 1970/09/29 [id number]  PACU RN called requesting a SOFT COLLAR  Ortho Devices Type of Ortho Device: Soft collar Ortho Device/Splint Location: NECK Ortho Device/Splint Interventions: Ordered, Application, Adjustment   Post Interventions Patient Tolerated: Well Instructions Provided: Care of device  Donald Pore 07/18/2020, 5:36 PM

## 2020-07-18 NOTE — Brief Op Note (Signed)
07/18/2020  4:54 PM  PATIENT:  Mario Alvarez  50 y.o. male  PRE-OPERATIVE DIAGNOSIS:  cervical herniated nucleus pulposus C 56 with severe spinal cord compression and cervical myelopathy, cervical stenosis  POST-OPERATIVE DIAGNOSIS:  cervical herniated nucleus pulposus C 56 with severe spinal cord compression and cervical myelopathy, cervical stenosis  PROCEDURE:  Procedure(s): ANTERIOR CERVICAL DECOMPRESSION/DISCECTOMY FUSION Cervical five - six (N/A) with PEEK cage, autograft, anterior cervical plate  SURGEON:  Surgeon(s) and Role:    Maeola Harman, MD - Primary  PHYSICIAN ASSISTANT: Julien Girt, NP  ASSISTANTS: none   ANESTHESIA:   general  EBL:  25 mL   BLOOD ADMINISTERED:none  DRAINS: none   LOCAL MEDICATIONS USED:  MARCAINE    and LIDOCAINE   SPECIMEN:  No Specimen  DISPOSITION OF SPECIMEN:  N/A  COUNTS:  YES  TOURNIQUET:  * No tourniquets in log *  DICTATION: Patient was brought to operating room and following the smooth and uncomplicated induction of general endotracheal anesthesia his head was placed on a horseshoe head holder he was placed in 5 pounds of Holter traction and his anterior neck was prepped and draped in usual sterile fashion. An incision was made on the left side of midline after infiltrating the skin and subcutaneous tissues with local lidocaine. The platysmal layer was incised and subplatysmal dissection was performed exposing the anterior border sternocleidomastoid muscle. Using blunt dissection the carotid sheath was kept lateral and trachea and esophagus kept medial exposing the anterior cervical spine. A bent spinal needle was placed it was felt to be the C 56 level and this was confirmed on intraoperative x-ray. Longus coli muscles were taken down from the anterior cervical spine using electrocautery and key elevator and self-retaining retractor was placed. The interspace was incised and a thorough discectomy was performed. Distraction pins were  placed. Uncinate spurs and central spondylitic ridges were drilled down with a high-speed drill. The spinal cord dura and both C 6 nerve roots were widely decompressed. A tremendous amount of herniated disc material, which was severely compressing the spinal cord, was removed.  The PLL was removed and the spinal cord dura was well-decompressed.  Hemostasis was assured with Surgifoam. After trial sizing a 7 mm large footprint peek interbody cage was selected and packed with local autograft. Tamped into position and countersunk appropriately. Distraction weight was removed. A 22 mm [place]  anterior cervical plate was affixed to the cervical spine with 14 mm variable-angle screws 2 at C 5 and 2 at C 6. All screws were well-positioned and locking mechanisms were engaged. Soft tissues were inspected and found to be in good repair. The wound was irrigated. A final X ray confirmed correct level, although it was difficult to visualize the implants well due to the patient's large body habitus.  The platysma layer was closed with 3-0 Vicryl stitches and the skin was reapproximated with 3-0 Vicryl subcuticular stitches. The wound was dressed with Dermabond and an occlusive dressing. Counts were correct at the end of the case. Patient was extubated and taken to recovery in stable and satisfactory condition.   PLAN OF CARE: Admit to inpatient   PATIENT DISPOSITION:  PACU - hemodynamically stable.   Delay start of Pharmacological VTE agent (>24hrs) due to surgical blood loss or risk of bleeding: yes

## 2020-07-18 NOTE — Op Note (Signed)
07/18/2020  4:54 PM  PATIENT:  Mario Alvarez  49 y.o. male  PRE-OPERATIVE DIAGNOSIS:  cervical herniated nucleus pulposus C 56 with severe spinal cord compression and cervical myelopathy, cervical stenosis  POST-OPERATIVE DIAGNOSIS:  cervical herniated nucleus pulposus C 56 with severe spinal cord compression and cervical myelopathy, cervical stenosis  PROCEDURE:  Procedure(s): ANTERIOR CERVICAL DECOMPRESSION/DISCECTOMY FUSION Cervical five - six (N/A) with PEEK cage, autograft, anterior cervical plate  SURGEON:  Surgeon(s) and Role:    * Shinita Mac, MD - Primary  PHYSICIAN ASSISTANT: McDaniel, NP  ASSISTANTS: none   ANESTHESIA:   general  EBL:  25 mL   BLOOD ADMINISTERED:none  DRAINS: none   LOCAL MEDICATIONS USED:  MARCAINE    and LIDOCAINE   SPECIMEN:  No Specimen  DISPOSITION OF SPECIMEN:  N/A  COUNTS:  YES  TOURNIQUET:  * No tourniquets in log *  DICTATION: Patient was brought to operating room and following the smooth and uncomplicated induction of general endotracheal anesthesia his head was placed on a horseshoe head holder he was placed in 5 pounds of Holter traction and his anterior neck was prepped and draped in usual sterile fashion. An incision was made on the left side of midline after infiltrating the skin and subcutaneous tissues with local lidocaine. The platysmal layer was incised and subplatysmal dissection was performed exposing the anterior border sternocleidomastoid muscle. Using blunt dissection the carotid sheath was kept lateral and trachea and esophagus kept medial exposing the anterior cervical spine. A bent spinal needle was placed it was felt to be the C 56 level and this was confirmed on intraoperative x-ray. Longus coli muscles were taken down from the anterior cervical spine using electrocautery and key elevator and self-retaining retractor was placed. The interspace was incised and a thorough discectomy was performed. Distraction pins were  placed. Uncinate spurs and central spondylitic ridges were drilled down with a high-speed drill. The spinal cord dura and both C 6 nerve roots were widely decompressed. A tremendous amount of herniated disc material, which was severely compressing the spinal cord, was removed.  The PLL was removed and the spinal cord dura was well-decompressed.  Hemostasis was assured with Surgifoam. After trial sizing a 7 mm large footprint peek interbody cage was selected and packed with local autograft. Tamped into position and countersunk appropriately. Distraction weight was removed. A 22 mm [place]  anterior cervical plate was affixed to the cervical spine with 14 mm variable-angle screws 2 at C 5 and 2 at C 6. All screws were well-positioned and locking mechanisms were engaged. Soft tissues were inspected and found to be in good repair. The wound was irrigated. A final X ray confirmed correct level, although it was difficult to visualize the implants well due to the patient's large body habitus.  The platysma layer was closed with 3-0 Vicryl stitches and the skin was reapproximated with 3-0 Vicryl subcuticular stitches. The wound was dressed with Dermabond and an occlusive dressing. Counts were correct at the end of the case. Patient was extubated and taken to recovery in stable and satisfactory condition.   PLAN OF CARE: Admit to inpatient   PATIENT DISPOSITION:  PACU - hemodynamically stable.   Delay start of Pharmacological VTE agent (>24hrs) due to surgical blood loss or risk of bleeding: yes  

## 2020-07-18 NOTE — Interval H&P Note (Signed)
History and Physical Interval Note:  07/18/2020 3:12 PM  Mario Alvarez  has presented today for surgery, with the diagnosis of cervical stenosis with myelopathy.  The various methods of treatment have been discussed with the patient and family. After consideration of risks, benefits and other options for treatment, the patient has consented to  Procedure(s) with comments: ANTERIOR CERVICAL DECOMPRESSION/DISCECTOMY FUSION C 5/6 (N/A) - 3 pm start as a surgical intervention.  The patient's history has been reviewed, patient examined, no change in status, stable for surgery.  I have reviewed the patient's chart and labs.  Questions were answered to the patient's satisfaction.     [name]

## 2020-07-19 ENCOUNTER — Encounter (HOSPITAL_COMMUNITY): Payer: Self-pay

## 2020-07-19 LAB — GLUCOSE, CAPILLARY
Glucose-Capillary: 244 mg/dL — ABNORMAL HIGH (ref 70–99)
Glucose-Capillary: 193 mg/dL — ABNORMAL HIGH (ref 70–99)
Glucose-Capillary: 241 mg/dL — ABNORMAL HIGH (ref 70–99)
Glucose-Capillary: 179 mg/dL — ABNORMAL HIGH (ref 70–99)

## 2020-07-19 LAB — HEMOGLOBIN A1C
Hgb A1c MFr Bld: 6.5 % — ABNORMAL HIGH (ref 4.8–5.6)
Mean Plasma Glucose: 140 mg/dL

## 2020-07-19 NOTE — Evaluation (Signed)
Physical Therapy Evaluation Patient Details Name: Mario Alvarez MRN: [id number] DOB: [birth year] Today's Date: 07/19/2020   History of Present Illness  Pt is a 50 y/o male with progressive B UE numbness/weakness and intermittent incontinence. 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 Pt underwent urgent ACDF C5-6 on 6/12. PMH: HTN, low potassium syndrome, and rotator cuff repair  Clinical Impression  Pt was seen for mobility on RW with help, and discussed his limits of help at home.  Pt has family who can be there for him, and is expecting to have therapy follow up with him.  Follow along with him to instruct body mechanics and to encourage gait and balance with staff as tolerated.  PT to follow for acute therapy goals and focus on gait with appropriate postural support.     Follow Up Recommendations Home health PT;Supervision for mobility/OOB    Equipment Recommendations  Rolling walker with 5" wheels    Recommendations for Other Services       Precautions / Restrictions Precautions Precautions: Fall;Cervical Precaution Booklet Issued: Yes (comment) Required Braces or Orthoses: Cervical Brace Cervical Brace: Soft collar;Other (comment) Restrictions Weight Bearing Restrictions: No      Mobility  Bed Mobility Overal bed mobility: Modified Independent             General bed mobility comments: edge of chair    Transfers Overall transfer level: Needs assistance Equipment used: Rolling walker (2 wheeled);1 person hand held assist Transfers: Sit to/from Stand Sit to Stand: Min guard         General transfer comment: min guard for safety of body mechanics  Ambulation/Gait Ambulation/Gait assistance: Min guard Gait Distance (Feet): 40 Feet Assistive device: Rolling walker (2 wheeled);1 person hand held assist Gait Pattern/deviations: Step-through pattern;Decreased stride length;Wide base of support Gait velocity: reduced Gait  velocity interpretation: <1.31 ft/sec, indicative of household ambulator General Gait Details: discussed advancing RLE or LLE first with walker, and pt is more comfortable with RLE first  Stairs            Wheelchair Mobility    Modified Rankin (Stroke Patients Only)       Balance Overall balance assessment: Needs assistance Sitting-balance support: No upper extremity supported;Feet supported Sitting balance-Leahy Scale: Good     Standing balance support: Bilateral upper extremity supported;During functional activity Standing balance-Leahy Scale: Fair Standing balance comment: requires RW for support of weaker LLE                             Pertinent Vitals/Pain Pain Assessment: 0-10 Pain Score: 3  Pain Location: L leg and specif knee Pain Descriptors / Indicators: Grimacing;Guarding Pain Intervention(s): Limited activity within patient's tolerance;Monitored during session;Premedicated before session;Repositioned    Home Living Family/patient expects to be discharged to:: Private residence Living Arrangements: Spouse/significant other Available Help at Discharge: Family;Available 24 hours/day Type of Home: House Home Access: Stairs to enter   [employer] of Steps: 1 Home Layout: Laundry or work area in basement;Two level Home Equipment: Cane - single point      Prior Function Level of Independence: Independent         Comments: more recent SPC use but had been independent for all mobility prior     Hand Dominance   Dominant Hand: Right    Extremity/Trunk Assessment   Upper Extremity Assessment Upper Extremity Assessment: Defer to OT evaluation    Lower Extremity Assessment Lower Extremity  Assessment: RLE deficits/detail;LLE deficits/detail RLE Deficits / Details: 4+ strength LLE Deficits / Details: LLE strength 3+ to 4- x 3- on L foot and great toe LLE Sensation: decreased light touch (present but reduced) LLE  Coordination: decreased gross motor    Cervical / Trunk Assessment Cervical / Trunk Assessment: Other exceptions (new cervical spine) Cervical / Trunk Exceptions: ACDF of C5-6, PEEK cage, autograft, ant cervical plate  Communication   Communication: No difficulties  Cognition Arousal/Alertness: Awake/alert Behavior During Therapy: WFL for tasks assessed/performed Overall Cognitive Status: Within Functional Limits for tasks assessed                                        General Comments General comments (skin integrity, edema, etc.): pt is in chair with testing of LE sensation and strength done, has weaker and number LLE    Exercises     Assessment/Plan    PT Assessment Patient needs continued PT services  PT Problem List Decreased strength;Decreased range of motion;Decreased activity tolerance;Decreased balance;Decreased coordination;Decreased knowledge of use of DME;Cardiopulmonary status limiting activity;Pain       PT Treatment Interventions DME instruction;Gait training;Stair training;Functional mobility training;Therapeutic activities;Therapeutic exercise;Balance training;Neuromuscular re-education;Patient/family education    PT Goals (Current goals can be found in the Care Plan section)  Acute Rehab PT Goals Patient Stated Goal: go home, improve strength PT Goal Formulation: With patient Time For Goal Achievement: 08/02/20 Potential to Achieve Goals: Good    Frequency Min 5X/week   Barriers to discharge Decreased caregiver support;Inaccessible home environment 1 STE at his home    Co-evaluation               AM-PAC PT "6 Clicks" Mobility  Outcome Measure Help needed turning from your back to your side while in a flat bed without using bedrails?: A Little Help needed moving from lying on your back to sitting on the side of a flat bed without using bedrails?: A Lot Help needed moving to and from a bed to a chair (including a wheelchair)?: A  Lot Help needed standing up from a chair using your arms (e.g., wheelchair or bedside chair)?: A Little Help needed to walk in hospital room?: A Little Help needed climbing 3-5 steps with a railing? : A Lot 6 Click Score: 15    End of Session Equipment Utilized During Treatment: Gait belt Activity Tolerance: Patient limited by fatigue;Treatment limited secondary to medical complications (Comment) Patient left: in chair;with call bell/phone within reach;with chair alarm set Nurse Communication: Mobility status PT Visit Diagnosis: Unsteadiness on feet (R26.81);Muscle weakness (generalized) (M62.81);Difficulty in walking, not elsewhere classified (R26.2)    Time: 9622-2979 PT Time Calculation (min) (ACUTE ONLY): 28 min   Charges:   PT Evaluation $PT Eval Moderate Complexity: 1 Mod PT Treatments $Gait Training: 8-22 mins       Ivar Drape 07/19/2020, 2:41 PM Samul Dada, PT MS Acute Rehab Dept. Number: [facility] [id number] and [facility] [phone]

## 2020-07-19 NOTE — Progress Notes (Addendum)
Subjective: Patient reports that he is doing well and is pleased with his postoperative status thus far. He reports an improvement in his BLE weakness and paraesthesia. He has appropriate mild incisional discomfort. Bilateral fingertip numbness persists.   Objective: Vital signs in last 24 hours: Temp:  [97 F (36.1 C)-98.9 F (37.2 C)] 98 F (36.7 C) (06/13 0358) Pulse Rate:  [63-100] 80 (06/13 0358) Resp:  [11-25] 11 (06/13 0358) BP: (126-161)/(57-117) 134/75 (06/13 0358) SpO2:  [94 %-98 %] 98 % (06/13 0358) Weight:  [123.6 kg] 123.6 kg (06/13 0358)  Intake/Output from previous day: 06/12 0701 - 06/13 0700 In: 2963.5 [P.O.:960; I.V.:1653.5; IV Piggyback:350] Out: 2325 [Urine:2300; Blood:25] Intake/Output this shift: Total I/O In: 1713.5 [P.O.:960; I.V.:653.5; IV Piggyback:100] Out: 1700 [Urine:1700]  Physical Exam: Patient is awake, A/O X 4, conversant, and in good spirits. Speech is fluent and appropriate. Doing well. MAEW with improvement in BLE and BLE strength. Improved numbness in his perineum and BLE. He reports B/L fingertip numbness persists unchanged at this point. Dressing is clean dry intact. Incision is well approximated with no drainage, erythema, or edema. Soft cervical collar in place.     Lab Results: Recent Labs    07/17/20 1253  WBC 8.4  HGB 13.9  HCT 38.5*  PLT 208   BMET Recent Labs    07/17/20 1253  NA 137  K 3.6  CL 101  CO2 27  GLUCOSE 117*  BUN 13  CREATININE 1.03  CALCIUM 9.2    Studies/Results: DG Cervical Spine 2 or 3 views  Result Date: 07/18/2020 CLINICAL DATA:  Cervical fusion. EXAM: CERVICAL SPINE - 2-3 VIEW COMPARISON:  None. FINDINGS: An initial portable cross-table lateral view of the cervical spine demonstrates a linear metallic localizer with its tip at the anterior aspect of the C5-6 disc space. A 2nd image demonstrates anterior screw and plate fixation at that level, not well visualized due to overlapping of the patient's  shoulders. IMPRESSION: Anterior cervical fusion at the C5-6 level. Electronically Signed   By: Beckie Salts M.D.   On: 07/18/2020 17:11   MR CERVICAL SPINE WO CONTRAST  Result Date: 07/17/2020 CLINICAL DATA:  Neck pain. Bilateral upper and lower extremity paresthesias with difficulty walking. EXAM: MRI CERVICAL SPINE WITHOUT CONTRAST TECHNIQUE: Multiplanar, multisequence MR imaging of the cervical spine was performed. No intravenous contrast was administered. COMPARISON:  Cervical spine x-rays dated July 15, 2020. FINDINGS: Alignment: Straightening of the normal cervical lordosis. No listhesis. Vertebrae: No fracture, evidence of discitis, or bone lesion. Cord: Severe cord compression at C5-C6 with associated cord edema. Posterior Fossa, vertebral arteries, paraspinal tissues: Negative. Disc levels: C2-C3:  Small shallow central disc protrusion.  No stenosis. C3-C4: Small to moderate central disc extrusion migrating inferiorly, deforming and flattening the ventral cord. Mild spinal canal stenosis. No neuroforaminal stenosis. C4-C5: Small central disc protrusion contacting and slightly deforming the ventral cord. No stenosis. C5-C6: Mild disc bulging and bilateral uncovertebral hypertrophy. Severe large left paracentral disc herniation with severe spinal canal stenosis and cord compression. Mild bilateral neuroforaminal stenosis. C6-C7: Small central disc protrusion with annular fissure contacting and slightly deforming the ventral cord. Mild spinal canal stenosis. No neuroforaminal stenosis. C7-T1:  Negative. IMPRESSION: 1. Large left paracentral disc herniation at C5-C6 resulting in severe spinal canal stenosis with cord compression and associated cord edema. 2. Smaller disc herniations at C3-C4, C5-C6, and C6-C7 with mild cord mass effect. These results will be called to the ordering clinician or representative by the Radiologist Assistant, and  communication documented in the PACS or [employer].  Electronically Signed   By: Obie Dredge M.D.   On: 07/17/2020 10:11    Assessment/Plan: Patient is post-op day 1 s/p C5-6 anterior cervical decompression/diskectomy/fusion with peek cage, autograft, and anterior cervical plate. He is recovering well and reports a significant improvement in his preoperative symptoms.  His only complaint is mild incisional discomfort and bilateral fingertip numbness. Continue soft cervical collar. Continue working on pain control. Will plan for PT/OT today.    LOS: 2 days     Council Mechanic, DNP, NP-C 07/19/2020, 6:57 AM   Patient is improving.  Mobilize with PT.  Home when doing well.

## 2020-07-19 NOTE — Evaluation (Signed)
Occupational Therapy Evaluation Patient Details Name: Mario Alvarez MRN: [id number] DOB: 1970/03/01 Today's Date: 07/19/2020    History of Present Illness Pt is a 50 y/o male with progressive B UE numbness/weakness and intermittent incontinence. 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 Pt underwent urgent ACDF C5-6 on 6/12. PMH: HTN, low potassium syndrome, and rotator cuff repair   Clinical Impression   PTA, pt lives with spouse and reports typically Independent with all daily tasks though has recently been using a cane for mobility due to weakness. Pt's spouse has also been assisting with tub transfers and LB bathing due to progressive weakness. Pt presents now with deficits in strength, coordination and sensation of B hands. Pt also with L LE weakness causing balance deficits. Pt requires Setup for UB ADLs, Min A for LB ADLs and Supervision for short distance mobility using RW (often reporting L LE feels wobbly). Educated pt on coordination and strengthening exercises for B hands (handouts provided). Encourage OP OT if these deficits persist. Will continue to follow acutely to address tub transfers and carryover of exercise program.     Follow Up Recommendations  Outpatient OT    Equipment Recommendations  3 in 1 bedside commode;Other (comment) (Rolling walker)    Recommendations for Other Services       Precautions / Restrictions Precautions Precautions: Fall;Cervical Precaution Booklet Issued: Yes (comment) Required Braces or Orthoses: Cervical Brace Cervical Brace: Soft collar;Other (comment) (off for showering, can remove in bed, can ambulate to bathroom without brace) Restrictions Weight Bearing Restrictions: No      Mobility Bed Mobility Overal bed mobility: Modified Independent             General bed mobility comments: to sit EOB after education on log rolling    Transfers Overall transfer level: Needs assistance Equipment  used: Rolling walker (2 wheeled) Transfers: Sit to/from Stand Sit to Stand: Supervision         General transfer comment: Supervision with increased time to power up, cues for posture and hand placement    Balance Overall balance assessment: Needs assistance Sitting-balance support: No upper extremity supported;Feet supported Sitting balance-Leahy Scale: Good     Standing balance support: Bilateral upper extremity supported;During functional activity Standing balance-Leahy Scale: Fair Standing balance comment: fair static standing, reliance on UE support for mobility due to feeling like L LE was wobbly                           ADL either performed or assessed with clinical judgement   ADL Overall ADL's : Needs assistance/impaired Eating/Feeding: Sitting;Set up   Grooming: Set up;Standing   Upper Body Bathing: Set up;Sitting   Lower Body Bathing: Minimal assistance;Sit to/from stand   Upper Body Dressing : Set up;Sitting   Lower Body Dressing: Minimal assistance;Sit to/from stand Lower Body Dressing Details (indicate cue type and reason): difficulty reaching L foot to adjust socks. educated on positional strategies to trial Toilet Transfer: Supervision/safety;Ambulation;RW   Toileting- Clothing Manipulation and Hygiene: Set up;Sit to/from stand       Functional mobility during ADLs: Supervision/safety;Rolling walker General ADL Comments: Pt with minor difficulties in ability to reach B feet for LB ADLs. Noted coordination, hand strength and sensation deficits in B hands. Provided squeeze ball, education on fine motor activities and theraputty HEP     Vision Patient Visual Report: No change from baseline Vision Assessment?: No apparent visual deficits  Perception     Praxis      Pertinent Vitals/Pain Pain Assessment: Faces Faces Pain Scale: Hurts a little bit Pain Location: L LE with stretching Pain Descriptors / Indicators: Sore;Tightness Pain  Intervention(s): Monitored during session;Other (comment) (encouraged exercises/stretching)     Hand Dominance Right   Extremity/Trunk Assessment Upper Extremity Assessment Upper Extremity Assessment: RUE deficits/detail;LUE deficits/detail RUE Deficits / Details: 3-5th digit numbness, decreased sensation for pressure. Difficulty with coordination, composite flexion, making fist. L > R RUE Sensation: decreased light touch RUE Coordination: decreased fine motor LUE Deficits / Details: 3-5th digit numbness, decreased sensation for pressure. Difficulty with coordination, composite flexion, making fist. L > R LUE Sensation: decreased light touch LUE Coordination: decreased fine motor   Lower Extremity Assessment Lower Extremity Assessment: Defer to PT evaluation   Cervical / Trunk Assessment Cervical / Trunk Assessment: Normal   Communication Communication Communication: No difficulties   Cognition Arousal/Alertness: Awake/alert Behavior During Therapy: WFL for tasks assessed/performed Overall Cognitive Status: Within Functional Limits for tasks assessed                                     General Comments  HR 70s-90s with activity, SpO2 WFL on RA. Wife and mother at bedside    Exercises     Shoulder Instructions      Home Living Family/patient expects to be discharged to:: Private residence Living Arrangements: Spouse/significant other Available Help at Discharge: Family;Available 24 hours/day Type of Home: House Home Access: Stairs to enter [employer] of Steps: 1   Home Layout: Laundry or work area in basement;Two level     Bathroom Shower/Tub: Chief Strategy Officer: Standard     Home Equipment: Cane - single point          Prior Functioning/Environment Level of Independence: Independent;Independent with assistive device(s)        Comments: Previously Independent in all tasks, had been using cane recently due to  weakness. Difficulty with tub transfers due to LE weakness. Wife assisted with bathing feet recently due to pain/weakness        OT Problem List: Decreased strength;Impaired balance (sitting and/or standing);Decreased coordination;Impaired sensation      OT Treatment/Interventions: Self-care/ADL training;Therapeutic exercise;DME and/or AE instruction;Therapeutic activities;Patient/family education;Balance training    OT Goals(Current goals can be found in the care plan section) Acute Rehab OT Goals Patient Stated Goal: go home, improve strength OT Goal Formulation: With patient Time For Goal Achievement: 08/02/20 Potential to Achieve Goals: Good ADL Goals Pt Will Perform Grooming: with modified independence;standing Pt Will Perform Lower Body Dressing: with modified independence;sit to/from stand;sitting/lateral leans;with adaptive equipment Pt Will Perform Tub/Shower Transfer: Tub transfer;with set-up;ambulating;3 in 1;rolling walker Pt/caregiver will Perform Home Exercise Program: Increased strength;Increased ROM;Both right and left upper extremity;With theraputty;Independently;With written HEP provided  OT Frequency: Min 2X/week   Barriers to D/C:            Co-evaluation              AM-PAC OT "6 Clicks" Daily Activity     Outcome Measure Help from another person eating meals?: A Little Help from another person taking care of personal grooming?: A Little Help from another person toileting, which includes using toliet, bedpan, or urinal?: A Little Help from another person bathing (including washing, rinsing, drying)?: A Little Help from another person to put on and taking off regular upper body clothing?:  A Little Help from another person to put on and taking off regular lower body clothing?: A Little 6 Click Score: 18   End of Session Equipment Utilized During Treatment: Rolling walker;Cervical collar Nurse Communication: Mobility status  Activity Tolerance: Patient  tolerated treatment well Patient left: in chair;with call bell/phone within reach;with family/visitor present  OT Visit Diagnosis: Unsteadiness on feet (R26.81);Muscle weakness (generalized) (M62.81)                Time: 8588-5027 OT Time Calculation (min): 31 min Charges:  OT General Charges $OT Visit: 1 Visit OT Evaluation $OT Eval Low Complexity: 1 Low OT Treatments $Self Care/Home Management : 8-22 mins  Bradd Canary, OTR/L Acute Rehab Services Office: [phone]   Lorre Munroe 07/19/2020, 8:43 AM

## 2020-07-20 LAB — GLUCOSE, CAPILLARY
Glucose-Capillary: 154 mg/dL — ABNORMAL HIGH (ref 70–99)
Glucose-Capillary: 172 mg/dL — ABNORMAL HIGH (ref 70–99)

## 2020-07-20 MED ORDER — OXYCODONE-ACETAMINOPHEN 5-325 MG PO TABS: 1.0000 | ORAL_TABLET | Freq: Four times a day (QID) | ORAL | 0 refills | Status: AC | PRN

## 2020-07-20 MED ORDER — METHOCARBAMOL 500 MG PO TABS: 500.0000 mg | ORAL_TABLET | Freq: Four times a day (QID) | ORAL | 1 refills | Status: DC | PRN

## 2020-07-20 NOTE — TOC Transition Note (Addendum)
Transition of Care (TOC) - CM/SW Discharge Note Marvetta Gibbons RN, BSN Transitions of Care Unit 4E- RN Case Manager See Treatment Team for direct phone #    Patient Details  Name: Mario Alvarez MRN: [id number] Date of Birth: [birth year]  Transition of Care [facility]) CM/SW Contact:  Dawayne Patricia, RN Phone Number: 07/20/2020, 12:09 PM   Clinical Narrative:    Pt stable for transition home today, orders placed for [facility] and DME needs.  CM spoke with wife at the bedside (pt currently sleeping), discussed [place] and DME needs. Wife agreeable to using in house provider for DME needs and delivered to room prior to discharge home.  List provided for [facility] choice Per CMS guidelines from medicare.gov website with star ratings (copy placed in shadow chart) , per wife her first top 3 choices are [facility], [place], and [employer].    Address, phone #s, and PCP all confirmed with wife in epic.   Call made to [facility] for DME needs- RW and 3n1 to be delivered to room prior to discharge home today.   Calls made to:  [facility]- unable to accept- [facility]- unable to accept- no contract with [employer]- unable to accept- no contract w/ [employer] in [place]  Returned to room to let wife know and see if she had any further preferences, wife asked for CM to reach out to [facility] and [facility] and then after that just reach out to the rest on the Medicare.gov list.   Calls made to [facility]- unable to accept-[facility]  [facility]- may be in-network- will verify once they review info- info/referral faxed to [facility] [phone]- referral pending at this time 1230- received call from [facility] with [facility]- confirmed that they are in-network with patient's insurance and can accept referral for start of care on Thur 6/16- pt has not met his deductible yet or out of pocket for the year and will have copay for each visit- $90 until deductible met then will be $18 until out of pocket met. This was discussed with pt and wife  who are agreeable to copay and verbalized that they understood.  [place] also is requesting an Therapist, sports to be added to orders for their start of care needs- they would do an RN visit for [facility], then start therapy. Called and spoke with Merrily Pew NP with Dr. Vertell Limber who will add the RN to [facility] order, once added will fax modified order back to [facility].  [place] order faxed to [facility]     Final next level of care: [place]     Patient Goals and CMS Choice Patient states their goals for this hospitalization and ongoing recovery are:: return home CMS Medicare.gov Compare Post Acute Care list provided to:: Patient Represenative (must comment) (wife) Choice offered to / list presented to : Spouse  Discharge Placement                 Home with [facility]      Discharge Plan and Services   Discharge Planning Services: CM Consult Post Acute Care Choice: Durable Medical Equipment, Home Health          DME Arranged: 3-N-1, Walker rolling DME Agency: [facility] Date DME Agency Contacted: 07/20/20 Time DME Agency Contacted: [phone] Representative spoke with at DME Agency: Freda Munro HH Arranged: OT, PT   Date [place]: 07/20/20 Time [place]: 1000    Social Determinants of Health ([place]) Interventions     Readmission Risk Interventions Readmission Risk Prevention Plan 07/20/2020  Post Dischage Appt Complete  Medication Screening Complete  Transportation Screening Complete  Some recent data might be hidden

## 2020-07-20 NOTE — Discharge Summary (Signed)
Physician Discharge Summary  Patient ID: Mario Alvarez MRN: [id number] DOB/AGE: June 02, 1970 50 y.o.  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 The patient tolerated the procedure well and was taken to the recovery room and then to the floor in stable condition. The hospital course was routine. There were no complications. The wound remained clean dry and intact. Pt had appropriate back soreness. No complaints of leg pain or new N/T/W. The patient remained afebrile with stable vital signs, and tolerated a regular diet. The patient continued to increase activities, and pain was well controlled with oral pain medications.   Consults: None  Significant Diagnostic Studies: radiology: X-Ray: intraoperative   Treatments: surgery: ANTERIOR CERVICAL DECOMPRESSION/DISCECTOMY FUSION Cervical five - six (N/A) with PEEK cage, autograft, anterior cervical plate  Discharge Exam: Blood pressure (!) 154/94, pulse 68, temperature 97.8 F (36.6 C), temperature source Oral, resp. rate 16, height 6' (1.829 m), weight 124.9 kg, SpO2 91 %.  Physical Exam: Patient is awake, A/O X 4, conversant, and in good spirits. Speech is fluent and appropriate. Doing well. MAEW with improvement in BLE and BLE strength. Improved numbness in his perineum and BLE. He reports B/L fingertip numbness persists unchanged at this point. Dressing is clean dry intact. Incision is well approximated with no drainage, erythema, or edema.  Soft cervical collar in place.   Disposition: Discharge disposition: 01-Home or Self Care       Discharge Instructions     Ambulatory referral to Occupational Therapy   Complete by: As directed    Face-to-face encounter (required for Medicare/Medicaid patients)   Complete by: As directed    I Council Mechanic certify that this patient is under my care and that I, or a nurse practitioner or physician's assistant working with me, had a face-to-face encounter that meets the physician face-to-face encounter requirements with this patient on 07/20/2020. 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, cervical stenosis   I certify that, based on my findings, the following services are medically necessary home health services: Physical therapy   Reason for Medically Necessary Home Health Services: Therapy- Therapeutic Exercises to Increase Strength and Endurance   My clinical findings support the need for the above services:  Unsafe ambulation due to balance issues Unable to leave home safely without assistance and/or assistive device     Further, I certify that my clinical findings support that this patient is homebound due to: Ambulates short distances less than 300 feet   Home Health   Complete by: As directed    To provide the following care/treatments:  PT OT        Allergies as of  07/20/2020       Reactions   Lisinopril    Mouth dry        Medication List     STOP taking these medications     tiZANidine 4 MG tablet Commonly known as: ZANAFLEX       TAKE these medications    ergocalciferol 1.25 MG (50000 UT) capsule Commonly known as: VITAMIN D2 Take 50,000 Units by mouth 2 (two) times a week. Monday, thursday   gabapentin 600 MG tablet Commonly known as: NEURONTIN Take 600 mg by mouth 3 (three) times daily.   losartan-hydrochlorothiazide 50-12.5 MG tablet Commonly known as: HYZAAR Take 1 tablet by mouth daily.   magnesium oxide 400 MG tablet Commonly known as: MAG-OX Take 400 mg by mouth daily.   meloxicam 15 MG tablet Commonly known as: MOBIC Take 15 mg by mouth daily.   metFORMIN 500 MG tablet Commonly known as: GLUCOPHAGE Take 500 mg by mouth 2 (two) times daily with a meal.   methocarbamol 500 MG tablet Commonly known as: ROBAXIN Take 1 tablet (500 mg total) by mouth every 6 (six) hours as needed for muscle spasms (Use SECOND if tizidine ineffective).   oxyCODONE-acetaminophen 5-325 MG tablet Commonly known as: Percocet Take 1-2 tablets by mouth every 6 (six) hours as needed for severe pain.   potassium chloride 10 MEQ tablet Commonly known as: KLOR-CON Take 10 mEq by mouth daily.         Signed: Council Mechanic, DNP, NP-C 07/20/2020, 8:31 AM

## 2020-07-20 NOTE — Progress Notes (Signed)
Subjective: Patient reports that he is continuing to improve with increased strength, especially in his BLE. Paraesthesia also is improving, however, bilateral fingertip numbness persists unchanged at this time. No acute events overnight. Patient has appropriate incisional and upper back/shoulder pain.   Objective: Vital signs in last 24 hours: Temp:  [97.7 F (36.5 C)-98.7 F (37.1 C)] 97.8 F (36.6 C) (06/14 0400) Pulse Rate:  [69-98] 85 (06/14 0400) Resp:  [13-18] 13 (06/14 0400) BP: (134-154)/(78-86) 134/79 (06/14 0400) SpO2:  [90 %-96 %] 92 % (06/14 0400) Weight:  [124.9 kg] 124.9 kg (06/14 0400)  Intake/Output from previous day: 06/13 0701 - 06/14 0700 In: 240.8 [P.O.:240; I.V.:0.8] Out: 2200 [Urine:2200] Intake/Output this shift: No intake/output data recorded.  Physical Exam: Patient is awake, A/O X 4, conversant, and in good spirits. Speech is fluent and appropriate. Doing well. MAEW with improvement in BLE and BLE strength. Improved numbness in his perineum and BLE. He reports B/L fingertip numbness persists unchanged at this point. Dressing is clean dry intact. Incision is well approximated with no drainage, erythema, or edema. Soft cervical collar in place.   Lab Results: Recent Labs    07/17/20 1253  WBC 8.4  HGB 13.9  HCT 38.5*  PLT 208   BMET Recent Labs    07/17/20 1253  NA 137  K 3.6  CL 101  CO2 27  GLUCOSE 117*  BUN 13  CREATININE 1.03  CALCIUM 9.2    Studies/Results: DG Cervical Spine 2 or 3 views  Result Date: 07/18/2020 CLINICAL DATA:  Cervical fusion. EXAM: CERVICAL SPINE - 2-3 VIEW COMPARISON:  None. FINDINGS: An initial portable cross-table lateral view of the cervical spine demonstrates a linear metallic localizer with its tip at the anterior aspect of the C5-6 disc space. A 2nd image demonstrates anterior screw and plate fixation at that level, not well visualized due to overlapping of the patient's shoulders. IMPRESSION: Anterior cervical  fusion at the C5-6 level. Electronically Signed   By: Beckie Salts M.D.   On: 07/18/2020 17:11    Assessment/Plan: Patient is post-op day 2 s/p C5-6 anterior cervical decompression/diskectomy/fusion with peek cage, autograft, and anterior cervical plate. He is continuing to recover well and reports a significant improvement in his preoperative symptoms. He is pleased with his postoperative status. His only complaint is mild incisional discomfort, upper back/shoulder pain, and bilateral fingertip numbness. Continue soft cervical collar. Continue working on pain control. Continue working with PT/OT. Continue working on pain control, mobility and ambulating patient. Will plan for discharge today.    Plan for home health PT and outpatient OT   LOS: 3 days     Council Mechanic, DNP, NP-C 07/20/2020, 7:40 AM

## 2020-07-20 NOTE — Progress Notes (Signed)
Physical Therapy Treatment Patient Details Name: Mario Alvarez MRN: [id number] DOB: [birth year] Today's Date: 07/20/2020    History of Present Illness Pt is a 50 y/o male with progressive B UE numbness/weakness and intermittent incontinence. 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 Pt underwent urgent ACDF C5-6 on 6/12. PMH: HTN, low potassium syndrome, and rotator cuff repair    PT Comments    Pt received in supine and spouse present, pt with good participation for transfer, gait and stair training. Pt able to progress gait distance with RW to household distances with Supervision to min guard and able to perform stair trial with up to minA and spouse given instruction on guarding techniques. Reviewed UE/LE HEP and pt/spouse receptive to instructions. Pt continues to benefit from PT services to progress toward functional mobility goals. Continue to recommend HHPT.  Follow Up Recommendations  Home health PT;Supervision for mobility/OOB     Equipment Recommendations  Rolling walker with 5" wheels; 3 in 1   Recommendations for Other Services       Precautions / Restrictions Precautions Precautions: Fall;Cervical Precaution Booklet Issued: Yes (comment) Reviewed handout 6/14 to reinforce log rolling. Required Braces or Orthoses: Cervical Brace Cervical Brace: Soft collar;Other (comment) Restrictions Weight Bearing Restrictions: No    Mobility  Bed Mobility Overal bed mobility: Needs Assistance Bed Mobility: Rolling;Sidelying to Sit;Sit to Sidelying Rolling: Supervision Sidelying to sit: Min guard     Sit to sidelying: Min guard General bed mobility comments: cues for log roll sequencing, HOB flat and no rail per home setup with min guard for technique/safety; good control/technique with teachback    Transfers Overall transfer level: Needs assistance Equipment used: Rolling walker (2 wheeled);1 person hand held assist Transfers: Sit to/from  Stand Sit to Stand: Min guard         General transfer comment: min guard for safety of body mechanics from elevated bed height to simulate home environment  Ambulation/Gait Ambulation/Gait assistance: Min guard Gait Distance (Feet): 70 Feet (14ft (with RW) + 75ft (with cane)) Assistive device: Rolling walker (2 wheeled);Straight cane Gait Pattern/deviations: Step-through pattern;Decreased stride length;Wide base of support Gait velocity: reduced Gait velocity interpretation: <1.31 ft/sec, indicative of household ambulator General Gait Details: discussed advancing RLE or LLE first with walker, and pt is more comfortable with RLE first   Stairs Stairs: Yes Stairs assistance: Min assist Stair Management: No rails;Step to pattern;With cane;Forwards Number of Stairs: 3 General stair comments: pt ascended/descended single step x3 reps with cane and minA via HHA on opposite hand to keep him steady, no overt LOB but slow to initiate/perform, good carryover of sequencing cues due to weaker LLE   Wheelchair Mobility    Modified Rankin (Stroke Patients Only)       Balance Overall balance assessment: Needs assistance Sitting-balance support: No upper extremity supported;Feet supported Sitting balance-Leahy Scale: Good     Standing balance support: Bilateral upper extremity supported;During functional activity Standing balance-Leahy Scale: Fair Standing balance comment: requires RW vs cane for support of weaker LLE                            Cognition Arousal/Alertness: Awake/alert Behavior During Therapy: WFL for tasks assessed/performed Overall Cognitive Status: Within Functional Limits for tasks assessed                                 General  Comments: pleasantly cooperative      Exercises Other Exercises Other Exercises: seated BLE AROM: LAQ, ankle pumps x10 reps Other Exercises: supine BUE AROM: finger opposition exercises and wrist gentle  ROM Other Exercises: supine BLE AROM: ankle pumps, heel slides, hip abduction x5-10 reps ea    General Comments General comments (skin integrity, edema, etc.): HR 80's bpm, SpO2 93-94% when pulse ox pleth signal good, cues for pursed-lip breathing and warm-up hand and leg exercises given      Pertinent Vitals/Pain Pain Assessment: Faces Faces Pain Scale: Hurts a little bit Pain Location: L leg and knee Pain Descriptors / Indicators: Guarding;Tender Pain Intervention(s): Monitored during session;Repositioned    Home Living                      Prior Function            PT Goals (current goals can now be found in the care plan section) Acute Rehab PT Goals Patient Stated Goal: go home, improve strength PT Goal Formulation: With patient Time For Goal Achievement: 08/02/20 Potential to Achieve Goals: Good Progress towards PT goals: Progressing toward goals    Frequency    Min 5X/week      PT Plan Current plan remains appropriate    Co-evaluation              AM-PAC PT "6 Clicks" Mobility   Outcome Measure  Help needed turning from your back to your side while in a flat bed without using bedrails?: A Little Help needed moving from lying on your back to sitting on the side of a flat bed without using bedrails?: A Little Help needed moving to and from a bed to a chair (including a wheelchair)?: A Little Help needed standing up from a chair using your arms (e.g., wheelchair or bedside chair)?: A Little Help needed to walk in hospital room?: A Little Help needed climbing 3-5 steps with a railing? : A Little 6 Click Score: 18    End of Session Equipment Utilized During Treatment: Gait belt;Cervical collar;Other (comment) (soft cx collar) Activity Tolerance: Patient tolerated treatment well Patient left: with call bell/phone within reach;in bed;with family/visitor present Nurse Communication: Mobility status PT Visit Diagnosis: Unsteadiness on feet  (R26.81);Muscle weakness (generalized) (M62.81);Difficulty in walking, not elsewhere classified (R26.2)     Time: 6629-4765 PT Time Calculation (min) (ACUTE ONLY): 36 min  Charges:  $Gait Training: 8-22 mins $Therapeutic Exercise: 8-22 mins                     Akiya Morr P., PTA Acute Rehabilitation Services Pager: [phone] Office: [phone]    Angus Palms 07/20/2020, 12:48 PM

## 2021-06-17 DIAGNOSIS — R1031 Right lower quadrant pain: Secondary | ICD-10-CM | POA: Insufficient documentation

## 2021-06-17 DIAGNOSIS — M25551 Pain in right hip: Secondary | ICD-10-CM | POA: Diagnosis present

## 2021-06-18 ENCOUNTER — Emergency Department (HOSPITAL_COMMUNITY): Payer: Medicaid - Out of State

## 2021-06-18 ENCOUNTER — Other Ambulatory Visit: Payer: Self-pay

## 2021-06-18 ENCOUNTER — Emergency Department (HOSPITAL_COMMUNITY)
Admission: EM | Admit: 2021-06-18 | Discharge: 2021-06-18 | Disposition: A | Discharge status: Home / Self Care | Payer: Medicaid - Out of State | Attending: Emergency Medicine | Admitting: Emergency Medicine

## 2021-06-18 ENCOUNTER — Encounter (HOSPITAL_COMMUNITY): Payer: Self-pay | Admitting: Emergency Medicine

## 2021-06-18 DIAGNOSIS — M25551 Pain in right hip: Secondary | ICD-10-CM

## 2021-06-18 LAB — COMPREHENSIVE METABOLIC PANEL
ALT: 20 U/L (ref 0–44)
AST: 18 U/L (ref 15–41)
Albumin: 4 g/dL (ref 3.5–5.0)
Alkaline Phosphatase: 44 U/L (ref 38–126)
Anion gap: 7 (ref 5–15)
BUN: 20 mg/dL (ref 6–20)
CO2: 27 mmol/L (ref 22–32)
Calcium: 9 mg/dL (ref 8.9–10.3)
Chloride: 103 mmol/L (ref 98–111)
Creatinine, Ser: 0.92 mg/dL (ref 0.61–1.24)
GFR, Estimated: >60 mL/min (ref >60)
Glucose, Bld: 148 mg/dL — ABNORMAL HIGH (ref 70–99)
Potassium: 3.3 mmol/L — ABNORMAL LOW (ref 3.5–5.1)
Sodium: 137 mmol/L (ref 135–145)
Total Bilirubin: 0.7 mg/dL (ref 0.3–1.2)
Total Protein: 7.8 g/dL (ref 6.5–8.1)

## 2021-06-18 LAB — CBC WITH DIFFERENTIAL/PLATELET
Abs Immature Granulocytes: 0.02 10*3/uL (ref 0.00–0.07)
Basophils Absolute: 0 10*3/uL (ref 0.0–0.1)
Basophils Relative: 0 %
Eosinophils Absolute: 0.1 10*3/uL (ref 0.0–0.5)
Eosinophils Relative: 1 %
HCT: 37.1 % — ABNORMAL LOW (ref 39.0–52.0)
Hemoglobin: 12.8 g/dL — ABNORMAL LOW (ref 13.0–17.0)
Immature Granulocytes: 0 %
Lymphocytes Relative: 34 %
Lymphs Abs: 3.4 10*3/uL (ref 0.7–4.0)
MCH: 30 pg (ref 26.0–34.0)
MCHC: 34.5 g/dL (ref 30.0–36.0)
MCV: 86.9 fL (ref 80.0–100.0)
Monocytes Absolute: 1 10*3/uL (ref 0.1–1.0)
Monocytes Relative: 10 %
Neutro Abs: 5.5 10*3/uL (ref 1.7–7.7)
Neutrophils Relative %: 55 %
Platelets: 225 10*3/uL (ref 150–400)
RBC: 4.27 MIL/uL (ref 4.22–5.81)
RDW: 13.1 % (ref 11.5–15.5)
WBC: 10.1 10*3/uL (ref 4.0–10.5)
nRBC: 0 % (ref 0.0–0.2)

## 2021-06-18 MED ORDER — HYDROMORPHONE HCL 1 MG/ML IJ SOLN
1.0000 mg | Freq: Once | INTRAMUSCULAR | Status: AC
  Administered 2021-06-18: 1 mg via INTRAVENOUS

## 2021-06-18 MED ORDER — CYCLOBENZAPRINE HCL 10 MG PO TABS: 10.0000 mg | ORAL_TABLET | Freq: Two times a day (BID) | ORAL | 0 refills | Status: AC | PRN

## 2021-06-18 MED ORDER — HYDROMORPHONE HCL 1 MG/ML IJ SOLN
1.0000 mg | Freq: Once | INTRAMUSCULAR | Status: DC
  Filled 2021-06-18: qty 1

## 2021-06-18 NOTE — ED Triage Notes (Signed)
Pt states he thinks he has a pulled muscle. States he fell Sunday and now has R sided hip pain and groin pain. Pt ambulatory to triage with a cane. ?

## 2021-06-19 NOTE — ED Provider Notes (Signed)
?[facility] ?Provider Note ? ? ?CSN: [id number] ?Arrival date & time: 06/17/21  2329 ? ?  ? ?History ? ?Chief Complaint  ?Patient presents with  ? Hip Pain  ? ? ?Mario Alvarez is a 51 y.o. male. ? ?Patient with r sided hip pain after hurting it on Sunday. States he sat up in bed. When he stood up his right hip had sudden severe pain which caused him to fall. No other injuries. No fall before the pain. No urinary or GI symptoms.  ? ? ?Hip Pain ? ? ?  ? ?Home Medications ?Prior to Admission medications   ?Medication Sig Start Date End Date Taking? Authorizing Provider  ?cyclobenzaprine (FLEXERIL) 10 MG tablet Take 1 tablet (10 mg total) by mouth 2 (two) times daily as needed for muscle spasms. 06/18/21  Yes Rosendo Couser, Corene Cornea, MD  ?ergocalciferol (VITAMIN D2) 1.25 MG (50000 UT) capsule Take 50,000 Units by mouth 2 (two) times a week. Monday, thursday    [provider]  ?gabapentin (NEURONTIN) 600 MG tablet Take 600 mg by mouth 3 (three) times daily.    [provider]  ?losartan-hydrochlorothiazide (HYZAAR) 50-12.5 MG per tablet Take 1 tablet by mouth daily.    [provider]  ?magnesium oxide (MAG-OX) 400 MG tablet Take 400 mg by mouth daily.    [provider]  ?meloxicam (MOBIC) 15 MG tablet Take 15 mg by mouth daily.    [provider]  ?metFORMIN (GLUCOPHAGE) 500 MG tablet Take 500 mg by mouth 2 (two) times daily with a meal.    [provider]  ?oxyCODONE-acetaminophen (PERCOCET) 5-325 MG tablet Take 1-2 tablets by mouth every 6 (six) hours as needed for severe pain. 07/20/20 07/20/21  [name], NP  ?potassium chloride (K-DUR) 10 MEQ tablet Take 10 mEq by mouth daily.    [provider]  ?   ? ?Allergies    ?Lisinopril   ? ?Review of Systems   ?Review of Systems ? ?Physical Exam ?Updated Vital Signs ?BP 118/65   Pulse 76   Temp 98.9 ?F (37.2 ?C) (Oral)   Resp 18   Ht 6' (1.829 m)   Wt 124.3 kg   SpO2 95%   BMI 37.16  kg/m?  ?Physical Exam ?Vitals and nursing note reviewed.  ?Constitutional:   ?   Appearance: He is well-developed.  ?HENT:  ?   Head: Normocephalic and atraumatic.  ?   Mouth/Throat:  ?   Mouth: Mucous membranes are moist.  ?   Pharynx: Oropharynx is clear.  ?Eyes:  ?   Pupils: Pupils are equal, round, and reactive to light.  ?Cardiovascular:  ?   Rate and Rhythm: Normal rate.  ?Pulmonary:  ?   Effort: Pulmonary effort is normal. No respiratory distress.  ?Abdominal:  ?   General: There is no distension.  ?Musculoskeletal:     ?   General: Tenderness (over right pelvis/inguinal area) present. Normal range of motion.  ?   Cervical back: Normal range of motion.  ?Skin: ?   General: Skin is warm and dry.  ?Neurological:  ?   Mental Status: He is alert.  ? ? ?ED Results / Procedures / Treatments   ?Labs ?(all labs ordered are listed, but only abnormal results are displayed) ?Labs Reviewed  ?CBC WITH DIFFERENTIAL/PLATELET - Abnormal; Notable for the following components:  ?    Result Value  ? Hemoglobin 12.8 (*)   ? HCT 37.1 (*)   ? All other  components within normal limits  ?COMPREHENSIVE METABOLIC PANEL - Abnormal; Notable for the following components:  ? Potassium 3.3 (*)   ? Glucose, Bld 148 (*)   ? All other components within normal limits  ? ? ?EKG ?None ? ?Radiology ?CT Renal Stone Study ? ?Result Date: 06/18/2021 ?CLINICAL DATA:  Nephrolithiasis.  Right hip and right groin pain. EXAM: CT ABDOMEN AND PELVIS WITHOUT CONTRAST TECHNIQUE: Multidetector CT imaging of the abdomen and pelvis was performed following the standard protocol without IV contrast. RADIATION DOSE REDUCTION: This exam was performed according to the departmental dose-optimization program which includes automated exposure control, adjustment of the mA and/or kV according to patient size and/or use of iterative reconstruction technique. COMPARISON:  09/12/2013 FINDINGS: Lower chest: No acute abnormality. Hepatobiliary: No focal liver abnormality is  seen. No gallstones, gallbladder wall thickening, or biliary dilatation. Pancreas: Unremarkable. No pancreatic ductal dilatation or surrounding inflammatory changes. Spleen: Normal in size without focal abnormality. Adrenals/Urinary Tract: Adrenal glands are unremarkable. Kidneys are normal, without renal calculi, focal lesion, or hydronephrosis. Bladder is unremarkable. Stomach/Bowel: Stomach is within normal limits. Appendix appears normal. No evidence of bowel wall thickening, distention, or inflammatory changes. Vascular/Lymphatic: No significant vascular findings are present. No enlarged abdominal or pelvic lymph nodes. Reproductive: Prostate is unremarkable. Other: No free fluid or fluid collections. Small fat containing umbilical hernia. Musculoskeletal: Moderate degenerative disc disease with disc space narrowing, endplate spurring and vacuum disc noted at L3-4 and L4-5. Schmorl's node deformity is noted within the superior endplate of L4. IMPRESSION: 1. No acute findings within the abdomen or pelvis. 2. Lumbar degenerative disc disease. Electronically Signed   By: Kerby Moors M.D.   On: 06/18/2021 05:47  ? ?DG Hip Unilat W or Wo Pelvis 2-3 Views Right ? ?Result Date: 06/18/2021 ?CLINICAL DATA:  Status post fall with right-sided hip pain and groin pain. EXAM: DG HIP (WITH OR WITHOUT PELVIS) 2-3V RIGHT COMPARISON:  None Available. FINDINGS: There is no evidence of hip fracture or dislocation. Mild degenerative changes are seen in the form of lateral acetabular bony spurring. IMPRESSION: No acute osseous abnormality. Electronically Signed   By: Virgina Norfolk M.D.   On: 06/18/2021 01:29   ? ?Procedures ?Procedures  ? ? ?Medications Ordered in ED ?Medications  ?HYDROmorphone (DILAUDID) injection 1 mg (1 mg Intravenous Given 06/18/21 0453)  ? ? ?ED Course/ Medical Decision Making/ A&P ?  ?                        ?Medical Decision Making ?Amount and/or Complexity of Data Reviewed ?Labs: ordered. ?Radiology:  ordered. ? ?Risk ?Prescription drug management. ? ? ?No fracture. No stone. No other obvious causes. Ct scan reassuring. Pain improved, will change muscle relaxer a Mario bit. Fu w/ pcp if not improving.  ? ?Final Clinical Impression(s) / ED Diagnoses ?Final diagnoses:  ?Right hip pain  ? ? ?Rx / DC Orders ?ED Discharge Orders   ? ?      Ordered  ?  cyclobenzaprine (FLEXERIL) 10 MG tablet  2 times daily PRN       ? 06/18/21 0622  ? ?  ?  ? ?  ? ? ?  ?Merrily Pew, MD ?06/19/21 0457 ? ?
